# Patient Record
Sex: Male | Born: 1945 | Race: White | Hispanic: No | Marital: Married | Smoking: Former smoker
Health system: Southern US, Community
[De-identification: ages and names within clinical notes are randomized; demographics above are authoritative.]

## PROBLEM LIST (undated history)

## (undated) DIAGNOSIS — E785 Hyperlipidemia, unspecified: Secondary | ICD-10-CM

## (undated) DIAGNOSIS — G510 Bell's palsy: Secondary | ICD-10-CM

## (undated) DIAGNOSIS — E119 Type 2 diabetes mellitus without complications: Secondary | ICD-10-CM

## (undated) DIAGNOSIS — Z8669 Personal history of other diseases of the nervous system and sense organs: Secondary | ICD-10-CM

## (undated) DIAGNOSIS — D649 Anemia, unspecified: Secondary | ICD-10-CM

## (undated) DIAGNOSIS — I739 Peripheral vascular disease, unspecified: Secondary | ICD-10-CM

## (undated) DIAGNOSIS — I10 Essential (primary) hypertension: Secondary | ICD-10-CM

## (undated) DIAGNOSIS — N400 Enlarged prostate without lower urinary tract symptoms: Secondary | ICD-10-CM

## (undated) DIAGNOSIS — R7303 Prediabetes: Secondary | ICD-10-CM

## (undated) HISTORY — PX: TONSILLECTOMY: SUR1361

## (undated) HISTORY — DX: Prediabetes: R73.03

## (undated) HISTORY — DX: Peripheral vascular disease, unspecified: I73.9

## (undated) HISTORY — DX: Bell's palsy: G51.0

## (undated) HISTORY — PX: BACK SURGERY: SHX140

## (undated) HISTORY — PX: PROSTATE SURGERY: SHX751

## (undated) HISTORY — DX: Hyperlipidemia, unspecified: E78.5

---

## 2004-02-09 ENCOUNTER — Ambulatory Visit: Payer: Self-pay | Admitting: Ophthalmology

## 2008-01-07 ENCOUNTER — Ambulatory Visit: Payer: Self-pay | Admitting: Gastroenterology

## 2010-08-05 ENCOUNTER — Emergency Department: Payer: Self-pay | Admitting: Emergency Medicine

## 2010-08-07 ENCOUNTER — Emergency Department: Payer: Self-pay | Admitting: Emergency Medicine

## 2010-08-18 ENCOUNTER — Ambulatory Visit: Payer: Self-pay | Admitting: Urology

## 2010-08-23 ENCOUNTER — Ambulatory Visit: Payer: Self-pay | Admitting: Urology

## 2016-08-23 ENCOUNTER — Telehealth: Payer: Self-pay | Admitting: Cardiovascular Disease

## 2016-08-23 DIAGNOSIS — Z83438 Family history of other disorder of lipoprotein metabolism and other lipidemia: Secondary | ICD-10-CM

## 2016-08-23 NOTE — Telephone Encounter (Signed)
Spoke with patient and confirmed CT calcium scoring test scheduled for 08/29/16 at 4:00 PM and to arrive at 3:45 PM to register and cost will be $150.00 the day of testing. He verbalized understanding of our conversation, agreement with plan, and had no further questions at this time.

## 2016-08-23 NOTE — Telephone Encounter (Signed)
Pt calling stating he lives next door to Dr Mariah MillingGollan and was told that he would be receiving a call from us to schedule a CT Calcium score   Please advise if patient can get this done before seeing Dr Mariah MillingGollan

## 2016-08-23 NOTE — Telephone Encounter (Signed)
Patient states that he is Dr. Windell HummingbirdGollan's neighbor and they were discussing CT calcium scoring. Let him know that I would get it scheduled and give him a call back. He was appreciative for the call.

## 2016-08-29 ENCOUNTER — Ambulatory Visit (INDEPENDENT_AMBULATORY_CARE_PROVIDER_SITE_OTHER)
Admission: RE | Admit: 2016-08-29 | Discharge: 2016-08-29 | Disposition: A | Discharge status: Home / Self Care | Payer: Self-pay | Source: Ambulatory Visit | Attending: Cardiovascular Disease | Admitting: Cardiovascular Disease

## 2016-08-29 DIAGNOSIS — Z8349 Family history of other endocrine, nutritional and metabolic diseases: Secondary | ICD-10-CM

## 2016-08-29 DIAGNOSIS — Z83438 Family history of other disorder of lipoprotein metabolism and other lipidemia: Secondary | ICD-10-CM

## 2016-08-31 ENCOUNTER — Telehealth: Payer: Self-pay | Admitting: *Deleted

## 2016-08-31 DIAGNOSIS — R9430 Abnormal result of cardiovascular function study, unspecified: Secondary | ICD-10-CM

## 2016-08-31 NOTE — Telephone Encounter (Signed)
-----   Message from Antonieta Ibaimothy J Gollan, MD sent at 08/31/2016 11:51 AM EDT ----- CT coronary calcium score markedly elevated Mr Cheree DittoGraham is aware Would order exercise myoview with me next week Suggest crestor 40 daily (would check with Dr. Judithann SheenSparks) He is on aspirin 81 daily

## 2016-08-31 NOTE — Telephone Encounter (Signed)
Left voicemail message to call back  

## 2016-09-01 ENCOUNTER — Encounter: Payer: Self-pay | Admitting: *Deleted

## 2016-09-01 MED ORDER — ASPIRIN EC 81 MG PO TBEC: 81.0000 mg | DELAYED_RELEASE_TABLET | Freq: Every day | ORAL | 3 refills | Status: AC

## 2016-09-01 NOTE — Telephone Encounter (Signed)
Spoke with patient and reviewed with him that Dr. Mariah MillingGollan wanted me to give him a call and schedule stress test with him. He verbalized understanding and agreement with plan. Put him in for stress test next Wednesday 09/07/16 at 08:00 AM and reviewed all instructions for testing. He verbalized understanding and requested that I mail him letter with this information. He was very appreciative for the call and had no further questions at this time.

## 2016-09-06 ENCOUNTER — Telehealth: Payer: Self-pay | Admitting: Cardiovascular Disease

## 2016-09-06 NOTE — Telephone Encounter (Signed)
Left detailed voicemail message with stress test instructions, appointment information, and number to call back if any further questions.

## 2016-09-07 ENCOUNTER — Encounter
Admission: RE | Admit: 2016-09-07 | Discharge: 2016-09-07 | Disposition: A | Discharge status: Home / Self Care | Payer: Medicare Other | Source: Ambulatory Visit | Attending: Cardiovascular Disease | Admitting: Cardiovascular Disease

## 2016-09-07 DIAGNOSIS — R9430 Abnormal result of cardiovascular function study, unspecified: Secondary | ICD-10-CM | POA: Insufficient documentation

## 2016-09-07 LAB — NM MYOCAR MULTI W/SPECT W/WALL MOTION / EF
CHL CUP NUCLEAR SDS: 0
CHL CUP NUCLEAR SSS: 2
CSEPED: 9 min
CSEPEDS: 7 s
Estimated workload: 10.1 METS
LV sys vol: 32 mL
LVDIAVOL: 86 mL (ref 62–150)
Peak HR: 129 {beats}/min
Percent HR: 87 %
Rest HR: 63 {beats}/min
SRS: 5
TID: 0.85

## 2016-09-07 MED ORDER — TECHNETIUM TC 99M TETROFOSMIN IV KIT
13.0000 | PACK | Freq: Once | INTRAVENOUS | Status: AC | PRN
  Administered 2016-09-07: 13.093 via INTRAVENOUS

## 2016-09-07 MED ORDER — TECHNETIUM TC 99M TETROFOSMIN IV KIT
31.2630 | PACK | Freq: Once | INTRAVENOUS | Status: AC | PRN
  Administered 2016-09-07: 31.263 via INTRAVENOUS

## 2016-10-02 DIAGNOSIS — I25118 Atherosclerotic heart disease of native coronary artery with other forms of angina pectoris: Secondary | ICD-10-CM | POA: Insufficient documentation

## 2016-10-02 DIAGNOSIS — E782 Mixed hyperlipidemia: Secondary | ICD-10-CM | POA: Insufficient documentation

## 2016-10-02 NOTE — Progress Notes (Signed)
Cardiology Office Note  Date:  10/03/2016   ID:  Vincent Kirby, DOB 11/23/1945, MRN [id number]  PCP:  Marguarite ArbourSparks, Jeffrey D, MD   Chief Complaint  Patient presents with  . other    Self ref for cardiac evaluation. Meds reviewed by the pt. verbally. Pt. denies chest pain or shortness of breath.     HPI:  71 yo gentleman with  CAD on CT scan Stress test with no ischemia, Hyperlipidemia, 240 on no statin Prior smoking history 15 years,  Who presents to establish care in the office for follow-up of his coronary artery disease  He denies any chest pain concerning for angina He was concerned about his risk factors for coronary artery disease given strong family history Underwent workup as below  CT coronary calcium score 08/29/2016 Coronary calcium score of 1645. This was 4789 percentile for age and sex matched control. This a very high calcium score.  Stress test performed 09/07/2016 showing no ischemia, ejection fraction 58%, hypertension at peak stress  EKG personally reviewed by myself on todays visit Shows normal sinus rhythm with no significant ST or T-wave changes  Lab work reviewed personally by myself showing Hemoglobin A1c 5.9 Recent total cholesterol 240 09/25/2015 On his statin total cholesterol 160s Currently on simvastatin 20 mg daily own  EKG personally reviewed by myself on todays visit Shows normal sinus rhythm with rate 61 bpm no significant ST or T-wave changes   PMH:   has a past medical history of Bell's palsy; Hyperlipidemia; Pre-diabetes; and PVD (peripheral vascular disease) (HCC).  PSH:    Past Surgical History:  Procedure Laterality Date  . BACK SURGERY     x 3   . TONSILLECTOMY      Current Outpatient Prescriptions  Medication Sig Dispense Refill  . aspirin EC 81 MG tablet Take 1 tablet (81 mg total) by mouth daily. 90 tablet 3  . Multiple Vitamin (MULTI-VITAMINS) TABS Take by mouth daily.     . simvastatin (ZOCOR) 20 MG tablet Take 20 mg by  mouth at bedtime.    . sodium chloride (OCEAN) 0.65 % nasal spray Place 2 sprays into the nose.      No current facility-administered medications for this visit.      Allergies:   Patient has no known allergies.   Social History:  The patient  reports that he quit smoking about 38 years ago. He has a 15.00 pack-year smoking history. He has never used smokeless tobacco. He reports that he does not drink alcohol or use drugs.   Family History:   family history includes Hypertension in his brother, father, and mother; Stroke (age of onset: 6184) in his father; Stroke (age of onset: 10393) in his mother.    Review of Systems: Review of Systems  Constitutional: Negative.   Respiratory: Negative.   Cardiovascular: Negative.   Gastrointestinal: Negative.   Musculoskeletal: Negative.   Neurological: Negative.   Psychiatric/Behavioral: Negative.   All other systems reviewed and are negative.    PHYSICAL EXAM: VS:  BP 138/70 (BP Location: Right Arm, Patient Position: Sitting, Cuff Size: Normal)   Ht 6' (1.829 m)   Wt 169 lb (76.7 kg)   BMI 22.92 kg/m  , BMI Body mass index is 22.92 kg/m. GEN: Well nourished, well developed, in no acute distress  HEENT: normal  Neck: no JVD, carotid bruits, or masses Cardiac: RRR; no murmurs, rubs, or gallops,no edema  Respiratory:  clear to auscultation bilaterally, normal work of breathing GI: soft, nontender,  nondistended, + BS MS: no deformity or atrophy  Skin: warm and dry, no rash Neuro:  Strength and sensation are intact Psych: euthymic mood, full affect the   Recent Labs: No results found for requested labs within last 8760 hours.    Lipid Panel No results found for: CHOL, HDL, LDLCALC, TRIG    Wt Readings from Last 3 Encounters:  10/03/16 169 lb (76.7 kg)       ASSESSMENT AND PLAN:  Coronary artery disease of native artery of native heart with stable angina pectoris (HCC) Images reviewed with him, CT scan showing diffuse heavy  coronary calcifications in the LAD and RCA in particular. Score of 1700 Based on this scan we did stress testing showing normal perfusion with no ischemia He is also asymptomatic with no symptoms concerning for angina, no further testing at this time. Discussed symptoms of angina to watch for.   Mixed hyperlipidemia Cholesterol above goal given underlying coronary disease on CT scan Recommended he take Crestor 40 mg daily Would cut the pill in half for several weeks before increasing to a full pill I did discuss other options including staying on simvastatin and adding zetia daily   Disposition:   F/U  As needed   Total encounter time more than 45 minutes  Greater than 50% was spent in counseling and coordination of care with the patient   No orders of the defined types were placed in this encounter.    Signed, Dossie Arbour, M.D., Ph.D. 10/03/2016  [facility] [place], [place] [phone]

## 2016-10-03 ENCOUNTER — Encounter: Payer: Self-pay | Admitting: Cardiovascular Disease

## 2016-10-03 ENCOUNTER — Ambulatory Visit (INDEPENDENT_AMBULATORY_CARE_PROVIDER_SITE_OTHER): Payer: Medicare Other | Admitting: Cardiovascular Disease

## 2016-10-03 VITALS — BP 138/70 | HR 61 | Ht 72.0 in | Wt 169.0 lb

## 2016-10-03 DIAGNOSIS — E782 Mixed hyperlipidemia: Secondary | ICD-10-CM | POA: Diagnosis not present

## 2016-10-03 DIAGNOSIS — I209 Angina pectoris, unspecified: Secondary | ICD-10-CM | POA: Diagnosis not present

## 2016-10-03 DIAGNOSIS — I25118 Atherosclerotic heart disease of native coronary artery with other forms of angina pectoris: Secondary | ICD-10-CM

## 2016-10-03 MED ORDER — ROSUVASTATIN CALCIUM 40 MG PO TABS: 40.0000 mg | ORAL_TABLET | Freq: Every day | ORAL | 4 refills | Status: DC

## 2016-10-03 NOTE — Patient Instructions (Addendum)
Medication Instructions:   Option 1 Stay on simvastatin and add zetia  Option 2 Start crestor 40 mg daily   Labwork:  No new labs needed  Testing/Procedures:  No further testing at this time   Follow-Up: It was a pleasure seeing you in the office today. Please call us if you have new issues that need to be addressed before your next appt.  [phone]  Your physician wants you to follow-up in:  As needed  If you need a refill on your cardiac medications before your next appointment, please call your pharmacy.

## 2018-01-29 ENCOUNTER — Encounter: Payer: Self-pay | Admitting: *Deleted

## 2018-01-30 ENCOUNTER — Ambulatory Visit: Payer: Medicare Other | Admitting: Anesthesiology

## 2018-01-30 ENCOUNTER — Encounter
Admission: RE | Disposition: A | Discharge status: Home / Self Care | Payer: Self-pay | Source: Ambulatory Visit | Attending: Gastroenterology

## 2018-01-30 ENCOUNTER — Ambulatory Visit
Admission: RE | Admit: 2018-01-30 | Discharge: 2018-01-30 | Disposition: A | Discharge status: Home / Self Care | Payer: Medicare Other | Source: Ambulatory Visit | Attending: Gastroenterology | Admitting: Gastroenterology

## 2018-01-30 DIAGNOSIS — Z7982 Long term (current) use of aspirin: Secondary | ICD-10-CM | POA: Diagnosis not present

## 2018-01-30 DIAGNOSIS — K621 Rectal polyp: Secondary | ICD-10-CM | POA: Diagnosis not present

## 2018-01-30 DIAGNOSIS — E1151 Type 2 diabetes mellitus with diabetic peripheral angiopathy without gangrene: Secondary | ICD-10-CM | POA: Insufficient documentation

## 2018-01-30 DIAGNOSIS — Z1211 Encounter for screening for malignant neoplasm of colon: Secondary | ICD-10-CM | POA: Insufficient documentation

## 2018-01-30 DIAGNOSIS — K635 Polyp of colon: Secondary | ICD-10-CM | POA: Diagnosis not present

## 2018-01-30 DIAGNOSIS — Z79899 Other long term (current) drug therapy: Secondary | ICD-10-CM | POA: Insufficient documentation

## 2018-01-30 DIAGNOSIS — K5730 Diverticulosis of large intestine without perforation or abscess without bleeding: Secondary | ICD-10-CM | POA: Diagnosis not present

## 2018-01-30 DIAGNOSIS — E785 Hyperlipidemia, unspecified: Secondary | ICD-10-CM | POA: Diagnosis not present

## 2018-01-30 DIAGNOSIS — I10 Essential (primary) hypertension: Secondary | ICD-10-CM | POA: Diagnosis not present

## 2018-01-30 HISTORY — DX: Benign prostatic hyperplasia without lower urinary tract symptoms: N40.0

## 2018-01-30 HISTORY — DX: Personal history of other diseases of the nervous system and sense organs: Z86.69

## 2018-01-30 HISTORY — PX: COLONOSCOPY WITH PROPOFOL: SHX5780

## 2018-01-30 HISTORY — DX: Essential (primary) hypertension: I10

## 2018-01-30 HISTORY — DX: Type 2 diabetes mellitus without complications: E11.9

## 2018-01-30 SURGERY — COLONOSCOPY WITH PROPOFOL
Anesthesia: General

## 2018-01-30 MED ORDER — PROPOFOL 500 MG/50ML IV EMUL
INTRAVENOUS | Status: DC | PRN
  Administered 2018-01-30: 80 ug/kg/min via INTRAVENOUS

## 2018-01-30 MED ORDER — PROPOFOL 500 MG/50ML IV EMUL
INTRAVENOUS | Status: AC
  Filled 2018-01-30: qty 50

## 2018-01-30 MED ORDER — SODIUM CHLORIDE 0.9 % IV SOLN
INTRAVENOUS | Status: DC
  Administered 2018-01-30: 1000 mL via INTRAVENOUS

## 2018-01-30 MED ORDER — PROPOFOL 10 MG/ML IV BOLUS
INTRAVENOUS | Status: DC | PRN
  Administered 2018-01-30: 30 mg via INTRAVENOUS
  Administered 2018-01-30: 50 mg via INTRAVENOUS

## 2018-01-30 NOTE — Anesthesia Preprocedure Evaluation (Addendum)
Anesthesia Evaluation  Patient identified by MRN, date of birth, ID band Patient awake    Reviewed: Allergy & Precautions, H&P , NPO status , Patient's Chart, lab work & pertinent test results  Airway Mallampati: III       Dental  (+) Chipped   Pulmonary neg COPD, former smoker,           Cardiovascular hypertension, (-) angina (denies)+ CAD and + Peripheral Vascular Disease       Neuro/Psych  Neuromuscular disease (h/o Bell's palsy) negative psych ROS   GI/Hepatic negative GI ROS, Neg liver ROS,   Endo/Other  diabetes  Renal/GU negative Renal ROS  negative genitourinary   Musculoskeletal   Abdominal   Peds  Hematology negative hematology ROS (+)   Anesthesia Other Findings Right sided facial droop  Past Medical History: No date: Bell's palsy No date: BPH (benign prostatic hyperplasia) No date: Diabetes mellitus without complication (HCC) No date: History of Bell's palsy No date: Hyperlipidemia No date: Hypertension     Comment:  BORDERLINE No date: Pre-diabetes No date: PVD (peripheral vascular disease) (HCC)  Past Surgical History: No date: BACK SURGERY     Comment:  x 3  No date: PROSTATE SURGERY No date: TONSILLECTOMY  BMI    Body Mass Index:  23.06 kg/m      Reproductive/Obstetrics negative OB ROS                            Anesthesia Physical Anesthesia Plan  ASA: III  Anesthesia Plan: General   Post-op Pain Management:    Induction:   PONV Risk Score and Plan: Propofol infusion and TIVA  Airway Management Planned: Natural Airway and Nasal Cannula  Additional Equipment:   Intra-op Plan:   Post-operative Plan:   Informed Consent: I have reviewed the patients History and Physical, chart, labs and discussed the procedure including the risks, benefits and alternatives for the proposed anesthesia with the patient or authorized representative who has indicated  his/her understanding and acceptance.   Dental Advisory Given  Plan Discussed with: Anesthesiologist, CRNA and Surgeon  Anesthesia Plan Comments:        Anesthesia Quick Evaluation

## 2018-01-30 NOTE — Transfer of Care (Signed)
Immediate Anesthesia Transfer of Care Note  Patient: Vincent Kirby  Procedure(s) Performed: COLONOSCOPY WITH PROPOFOL (N/A )  Patient Location: PACU  Anesthesia Type:General  Level of Consciousness: awake  Airway & Oxygen Therapy: Patient Spontanous Breathing  Post-op Assessment: Report given to RN  Post vital signs: stable  Last Vitals:  Vitals Value Taken Time  BP 101/54 01/30/2018 10:35 AM  Temp 36.2 C 01/30/2018 10:34 AM  Pulse 60 01/30/2018 10:36 AM  Resp 12 01/30/2018 10:36 AM  SpO2 99 % 01/30/2018 10:36 AM  Vitals shown include unvalidated device data.  Last Pain:  Vitals:   01/30/18 1034  TempSrc: Tympanic  PainSc: 0-No pain         Complications: No apparent anesthesia complications

## 2018-01-30 NOTE — H&P (Signed)
Outpatient short stay form Pre-procedure 01/30/2018 10:00 AM Christena DeemMartin U Gerline Ratto MD  Primary Physician: Dr. Aram BeechamJeffrey Sparks  Reason for visit: Colonoscopy  History of present illness: Patient is a 73 year old male presenting today for colonoscopy in regards colon cancer screening.  His last colonoscopy was in 2009 showing diverticulosis at that time negative otherwise.  He tolerated his prep well.  He takes no aspirin or blood thinning agent the exception of a 81 mg aspirin..    Current Facility-Administered Medications:  .  0.9 %  sodium chloride infusion, , Intravenous, Continuous, Christena DeemSkulskie, Richrd Kuzniar U, MD, Last Rate: 20 mL/hr at 01/30/18 0932, 1,000 mL at 01/30/18 0932  Facility-Administered Medications Ordered in Other Encounters:  .  propofol (DIPRIVAN) 500 MG/50ML infusion, , , Continuous PRN, Camille BalSandstrom, Tony O, CRNA, Last Rate: 37 mL/hr at 01/30/18 0959, 80 mcg/kg/min at 01/30/18 0959  Medications Prior to Admission  Medication Sig Dispense Refill Last Dose  . aspirin EC 81 MG tablet Take 1 tablet (81 mg total) by mouth daily. 90 tablet 3 Past Week at Unknown time  . Multiple Vitamin (MULTI-VITAMINS) TABS Take by mouth daily.    Past Week at Unknown time  . rosuvastatin (CRESTOR) 40 MG tablet Take 1 tablet (40 mg total) by mouth daily. 90 tablet 4 Past Week at Unknown time  . simvastatin (ZOCOR) 20 MG tablet Take 20 mg by mouth at bedtime.   Past Week at Unknown time  . sodium chloride (OCEAN) 0.65 % nasal spray Place 2 sprays into the nose.    Past Week at Unknown time     No Known Allergies   Past Medical History:  Diagnosis Date  . Bell's palsy   . BPH (benign prostatic hyperplasia)   . Diabetes mellitus without complication (HCC)   . History of Bell's palsy   . Hyperlipidemia   . Hypertension    BORDERLINE  . Pre-diabetes   . PVD (peripheral vascular disease) (HCC)     Review of systems:      Physical Exam    Heart and lungs: Regular rate and rhythm without rub or  gallop, lungs are bilaterally clear.    HEENT: Normocephalic atraumatic eyes are anicteric    Other:    Pertinant exam for procedure: Soft nontender nondistended bowel sounds positive normoactive.    Planned proceedures: Colonoscopy and indicated procedures. I have discussed the risks benefits and complications of procedures to include not limited to bleeding, infection, perforation and the risk of sedation and the patient wishes to proceed.    Christena DeemMartin U Chane Cowden, MD Gastroenterology 01/30/2018  10:00 AM

## 2018-01-30 NOTE — Op Note (Signed)
[facility] Gastroenterology Patient Name: Vincent Kirby Procedure Date: 01/30/2018 9:48 AM MRN: [id number] Account #: [id number] Date of Birth: 07/20/1945 Admit Type: Outpatient Age: 73 [facility] ENDO ROOM 3 Gender: Male Note Status: Finalized Procedure:            Colonoscopy Indications:          Screening for colorectal malignant neoplasm Providers:            Christena DeemMartin U. Harman Ferrin, MD Referring MD:         Duane LopeJeffrey D. Judithann SheenSparks, MD (Referring MD) Medicines:            Monitored Anesthesia Care Complications:        No immediate complications. Procedure:            Pre-Anesthesia Assessment:                       - ASA Grade Assessment: III - A patient with severe                        systemic disease.                       After obtaining informed consent, the colonoscope was                        passed under direct vision. Throughout the procedure,                        the patient's blood pressure, pulse, and oxygen                        saturations were monitored continuously. The was                        introduced through the anus and advanced to the the                        cecum, identified by appendiceal orifice and ileocecal                        valve. The colonoscopy was performed with moderate                        difficulty due to a tortuous colon. Successful                        completion of the procedure was aided by changing the                        patient to a prone position. The patient tolerated the                        procedure well. The quality of the bowel preparation                        was good. Findings:      Multiple medium-mouthed diverticula were found in the sigmoid colon and       descending colon.      A 2 mm polyp was found in the descending colon. The polyp was sessile.  The polyp was removed with a cold biopsy forceps. Resection and       retrieval were complete.      A 1 mm polyp was found in the  rectum. The polyp was sessile. The polyp       was removed with a cold biopsy forceps. Resection and retrieval were       complete.      The retroflexed view of the distal rectum and anal verge was normal and       showed no anal or rectal abnormalities.      The digital rectal exam was normal. Impression:           - Diverticulosis in the sigmoid colon and in the                        descending colon.                       - One 2 mm polyp in the descending colon, removed with                        a cold biopsy forceps. Resected and retrieved.                       - One 1 mm polyp in the rectum, removed with a cold                        biopsy forceps. Resected and retrieved.                       - The distal rectum and anal verge are normal on                        retroflexion view. Recommendation:       - Discharge patient to home.                       - Soft diet today, then advance as tolerated to advance                        diet as tolerated. Procedure Code(s):    --- Professional ---                       [phone], Colonoscopy, flexible; with biopsy, single or                        multiple Diagnosis Code(s):    --- Professional ---                       Z12.11, Encounter for screening for malignant neoplasm                        of colon                       D12.4, Benign neoplasm of descending colon                       K62.1, Rectal polyp  K57.30, Diverticulosis of large intestine without                        perforation or abscess without bleeding CPT copyright 2018 American Medical Association. All rights reserved. The codes documented in this report are preliminary and upon coder review may  be revised to meet current compliance requirements. Christena Deem, MD 01/30/2018 10:34:18 AM This report has been signed electronically. Number of Addenda: 0 Note Initiated On: 01/30/2018 9:48 AM Scope Withdrawal Time: 0 hours 8 minutes 29  seconds  Total Procedure Duration: 0 hours 23 minutes 0 seconds       [facility]

## 2018-01-30 NOTE — Anesthesia Post-op Follow-up Note (Signed)
Anesthesia QCDR form completed.        

## 2018-01-31 ENCOUNTER — Encounter: Payer: Self-pay | Admitting: Gastroenterology

## 2018-01-31 LAB — SURGICAL PATHOLOGY

## 2018-01-31 NOTE — Anesthesia Postprocedure Evaluation (Signed)
Anesthesia Post Note  Patient: Deaken Gillitzer  Procedure(s) Performed: COLONOSCOPY WITH PROPOFOL (N/A )  Patient location during evaluation: PACU Anesthesia Type: General Level of consciousness: awake and alert Pain management: pain level controlled Vital Signs Assessment: post-procedure vital signs reviewed and stable Respiratory status: spontaneous breathing, nonlabored ventilation and respiratory function stable Cardiovascular status: blood pressure returned to baseline and stable Postop Assessment: no apparent nausea or vomiting Anesthetic complications: no     Last Vitals:  Vitals:   01/30/18 1044 01/30/18 1054  BP: 114/73 125/70  Pulse: (!) 58 62  Resp: 17 19  Temp:    SpO2: 100% 100%    Last Pain:  Vitals:   01/31/18 0739  TempSrc:   PainSc: 0-No pain                 Jovita Gamma

## 2022-01-11 ENCOUNTER — Ambulatory Visit
Admission: RE | Admit: 2022-01-11 | Discharge: 2022-01-11 | Disposition: A | Discharge status: Home / Self Care | Payer: Medicare HMO | Source: Ambulatory Visit | Attending: Physician Assistant | Admitting: Physician Assistant

## 2022-01-11 ENCOUNTER — Other Ambulatory Visit: Payer: Self-pay | Admitting: Physician Assistant

## 2022-01-11 DIAGNOSIS — S32010A Wedge compression fracture of first lumbar vertebra, initial encounter for closed fracture: Secondary | ICD-10-CM | POA: Insufficient documentation

## 2022-08-09 ENCOUNTER — Ambulatory Visit: Payer: Self-pay | Admitting: General Surgery

## 2022-08-09 NOTE — H&P (View-Only) (Signed)
HISTORY OF PRESENT ILLNESS:    Vincent Kirby is a 77 y.o.male patient who comes for follow up for evaluation of right inguinal hernia.  Patient previously seen on May 26, 2022.  He was complaining of right inguinal hernia.  Patient endorses having a bulge in the right inguinal area.  He is able to reduce the bulge.  The bulge gets uncomfortable.  He is pretty active.  No pain radiation.  Aggravating factors certain activities.  No alleviating factors.  He denies any episode of abdominal distention, nausea or vomiting.  No previous intra-abdominal surgeries.  When he was previously evaluated and we recommended surgical intervention for the right inguinal hernia.  At that time he was obesity and he wanted to hold the surgery until August.  He comes back for preoperative history and physical exam.      PAST MEDICAL HISTORY:  Past Medical History:  Diagnosis Date   Borderline hypertension    BPH (benign prostatic hypertrophy)    Diet-controlled diabetes mellitus (CMS/HHS-HCC)    History of Bell's palsy    Hyperlipidemia    Peripheral vascular disease (CMS-HCC)         PAST SURGICAL HISTORY:   Past Surgical History:  Procedure Laterality Date   COLONOSCOPY  01/30/2018   Hyperplastic colon polyp/Repeat 34yrs/MUS   prostate laser surgery     Status post back trauma with surgery x three           MEDICATIONS:  Outpatient Encounter Medications as of 08/09/2022  Medication Sig Dispense Refill   aspirin 81 MG EC tablet Take 81 mg by mouth once daily.     multivitamin tablet Take 1 tablet by mouth once daily.     rosuvastatin (CRESTOR) 20 MG tablet Take 1 tablet (20 mg total) by mouth once daily 90 tablet 4   No facility-administered encounter medications on file as of 08/09/2022.     ALLERGIES:   Patient has no known allergies.   SOCIAL HISTORY:  Social History   Socioeconomic History   Marital status: Married  Tobacco Use   Smoking status: Former   Smokeless tobacco: Never  Theatre manager   Vaping status: Never Used  Substance and Sexual Activity   Alcohol use: No   Drug use: No   Sexual activity: Defer    FAMILY HISTORY:  Family History  Problem Relation Name Age of Onset   Coronary Artery Disease (Blocked arteries around heart) Other     High blood pressure (Hypertension) Other     Stroke Other     Ulcerative colitis Brother       GENERAL REVIEW OF SYSTEMS:   General ROS: negative for - chills, fatigue, fever, weight gain or weight loss Allergy and Immunology ROS: negative for - hives  Hematological and Lymphatic ROS: negative for - bleeding problems or bruising, negative for palpable nodes Endocrine ROS: negative for - heat or cold intolerance, hair changes Respiratory ROS: negative for - cough, shortness of breath or wheezing Cardiovascular ROS: no chest pain or palpitations GI ROS: negative for nausea, vomiting, abdominal pain, diarrhea, constipation Musculoskeletal ROS: negative for - joint swelling or muscle pain Neurological ROS: negative for - confusion, syncope Dermatological ROS: negative for pruritus and rash  PHYSICAL EXAM:  Vitals:   08/09/22 0743  BP: (!) 141/69  Pulse: 61  .  Ht:177.8 cm (5\' 10" ) Wt:78.9 kg (174 lb) QQV:ZDGL surface area is 1.97 meters squared. Body mass index is 24.97 kg/m.Marland Kitchen   GENERAL: Alert, active, oriented x3  HEENT: Pupils equal reactive to light. Extraocular movements are intact. Sclera clear. Palpebral conjunctiva normal red color.Pharynx clear.  NECK: Supple with no palpable mass and no adenopathy.  LUNGS: Sound clear with no rales rhonchi or wheezes.  HEART: Regular rhythm S1 and S2 without murmur.  ABDOMEN: Soft and depressible, nontender with no palpable mass, no hepatomegaly.  Right inguinal hernia, reducible  EXTREMITIES: Well-developed well-nourished symmetrical with no dependent edema.  NEUROLOGICAL: Awake alert oriented, facial expression symmetrical, moving all extremities.      IMPRESSION:      Non-recurrent unilateral inguinal hernia without obstruction or gangrene [K40.90]    Patient oriented again about the diagnosis of right inguinal hernia. He was oriented about the implication of having a hernia, age risk. He was also oriented about the treatment with surgical intervention.   He was oriented about the risk of surgery including bleeding, infection, injury to adjacent organs such as bladder, vas deferens, femoral artery or vein, nerves, chronic pain, among others.  The patient reported he understood and agreed to proceed with robotic assisted laparoscopic right inguinal hernia repair with manage.   PLAN:  1.  Robotic assisted laparoscopic right inguinal hernia repair with mesh (16109) 2.  Hold aspirin 5 days before procedure 3.  Contact us if has any question or concern.   Patient verbalized understanding, all questions were answered, and were agreeable with the plan outlined above.   Vincent Shiver, MD  Electronically signed by Vincent Shiver, MD

## 2022-08-09 NOTE — H&P (Signed)
HISTORY OF PRESENT ILLNESS:    Mr. Doxtater is a 77 y.o.male patient who comes for follow up for evaluation of right inguinal hernia.  Patient previously seen on May 26, 2022.  He was complaining of right inguinal hernia.  Patient endorses having a bulge in the right inguinal area.  He is able to reduce the bulge.  The bulge gets uncomfortable.  He is pretty active.  No pain radiation.  Aggravating factors certain activities.  No alleviating factors.  He denies any episode of abdominal distention, nausea or vomiting.  No previous intra-abdominal surgeries.  When he was previously evaluated and we recommended surgical intervention for the right inguinal hernia.  At that time he was obesity and he wanted to hold the surgery until August.  He comes back for preoperative history and physical exam.      PAST MEDICAL HISTORY:  Past Medical History:  Diagnosis Date   Borderline hypertension    BPH (benign prostatic hypertrophy)    Diet-controlled diabetes mellitus (CMS/HHS-HCC)    History of Bell's palsy    Hyperlipidemia    Peripheral vascular disease (CMS-HCC)         PAST SURGICAL HISTORY:   Past Surgical History:  Procedure Laterality Date   COLONOSCOPY  01/30/2018   Hyperplastic colon polyp/Repeat 34yrs/MUS   prostate laser surgery     Status post back trauma with surgery x three           MEDICATIONS:  Outpatient Encounter Medications as of 08/09/2022  Medication Sig Dispense Refill   aspirin 81 MG EC tablet Take 81 mg by mouth once daily.     multivitamin tablet Take 1 tablet by mouth once daily.     rosuvastatin (CRESTOR) 20 MG tablet Take 1 tablet (20 mg total) by mouth once daily 90 tablet 4   No facility-administered encounter medications on file as of 08/09/2022.     ALLERGIES:   Patient has no known allergies.   SOCIAL HISTORY:  Social History   Socioeconomic History   Marital status: Married  Tobacco Use   Smoking status: Former   Smokeless tobacco: Never  Theatre manager   Vaping status: Never Used  Substance and Sexual Activity   Alcohol use: No   Drug use: No   Sexual activity: Defer    FAMILY HISTORY:  Family History  Problem Relation Name Age of Onset   Coronary Artery Disease (Blocked arteries around heart) Other     High blood pressure (Hypertension) Other     Stroke Other     Ulcerative colitis Brother       GENERAL REVIEW OF SYSTEMS:   General ROS: negative for - chills, fatigue, fever, weight gain or weight loss Allergy and Immunology ROS: negative for - hives  Hematological and Lymphatic ROS: negative for - bleeding problems or bruising, negative for palpable nodes Endocrine ROS: negative for - heat or cold intolerance, hair changes Respiratory ROS: negative for - cough, shortness of breath or wheezing Cardiovascular ROS: no chest pain or palpitations GI ROS: negative for nausea, vomiting, abdominal pain, diarrhea, constipation Musculoskeletal ROS: negative for - joint swelling or muscle pain Neurological ROS: negative for - confusion, syncope Dermatological ROS: negative for pruritus and rash  PHYSICAL EXAM:  Vitals:   08/09/22 0743  BP: (!) 141/69  Pulse: 61  .  Ht:177.8 cm (5\' 10" ) Wt:78.9 kg (174 lb) QQV:ZDGL surface area is 1.97 meters squared. Body mass index is 24.97 kg/m.Marland Kitchen   GENERAL: Alert, active, oriented x3  HEENT: Pupils equal reactive to light. Extraocular movements are intact. Sclera clear. Palpebral conjunctiva normal red color.Pharynx clear.  NECK: Supple with no palpable mass and no adenopathy.  LUNGS: Sound clear with no rales rhonchi or wheezes.  HEART: Regular rhythm S1 and S2 without murmur.  ABDOMEN: Soft and depressible, nontender with no palpable mass, no hepatomegaly.  Right inguinal hernia, reducible  EXTREMITIES: Well-developed well-nourished symmetrical with no dependent edema.  NEUROLOGICAL: Awake alert oriented, facial expression symmetrical, moving all extremities.      IMPRESSION:      Non-recurrent unilateral inguinal hernia without obstruction or gangrene [K40.90]    Patient oriented again about the diagnosis of right inguinal hernia. He was oriented about the implication of having a hernia, age risk. He was also oriented about the treatment with surgical intervention.   He was oriented about the risk of surgery including bleeding, infection, injury to adjacent organs such as bladder, vas deferens, femoral artery or vein, nerves, chronic pain, among others.  The patient reported he understood and agreed to proceed with robotic assisted laparoscopic right inguinal hernia repair with manage.   PLAN:  1.  Robotic assisted laparoscopic right inguinal hernia repair with mesh (16109) 2.  Hold aspirin 5 days before procedure 3.  Contact us if has any question or concern.   Patient verbalized understanding, all questions were answered, and were agreeable with the plan outlined above.   Carolan Shiver, MD  Electronically signed by Carolan Shiver, MD

## 2022-08-23 ENCOUNTER — Encounter
Admission: RE | Admit: 2022-08-23 | Discharge: 2022-08-23 | Disposition: A | Discharge status: Home / Self Care | Payer: Medicare HMO | Source: Ambulatory Visit | Attending: General Surgery | Admitting: General Surgery

## 2022-08-23 ENCOUNTER — Other Ambulatory Visit: Payer: Self-pay

## 2022-08-23 NOTE — Patient Instructions (Signed)
Your procedure is scheduled on: Wednesday 08/31/22 To find out your arrival time, please call [phone] between 1PM - 3PM on:   Tuesday 08/30/22 Report to the Registration Desk on the 1st floor of the [facility]. Free Valet parking is available.  If your arrival time is 6:00 am, do not arrive before that time as the [facility] entrance doors do not open until 6:00 am.  REMEMBER: Instructions that are not followed completely may result in serious medical risk, up to and including death; or upon the discretion of your surgeon and anesthesiologist your surgery may need to be rescheduled.  Do not eat food or drink any liquids after midnight the night before surgery.  No gum chewing or hard candies.  One week prior to surgery: Stop Anti-inflammatories (NSAIDS) such as Advil, Aleve, Ibuprofen, Motrin, Naproxen, Naprosyn and Aspirin based products such as Excedrin, Goody's Powder, BC Powder. You may however, continue to take Tylenol if needed for pain up until the day of surgery.  Stop ANY OVER THE COUNTER supplements or vitamins until after surgery.  Continue taking all prescribed medications. Hold Aspirin for 5 days starting Friday 08/26/22  TAKE ONLY THESE MEDICATIONS THE MORNING OF SURGERY WITH A SIP OF WATER:  rosuvastatin (CRESTOR) 20 MG tablet   No Alcohol for 24 hours before or after surgery.  No Smoking including e-cigarettes for 24 hours before surgery.  No chewable tobacco products for at least 6 hours before surgery.  No nicotine patches on the day of surgery.  Do not use any "recreational" drugs for at least a week (preferably 2 weeks) before your surgery.  Please be advised that the combination of cocaine and anesthesia may have negative outcomes, up to and including death. If you test positive for cocaine, your surgery will be cancelled.  On the morning of surgery brush your teeth with toothpaste and water, you may rinse your mouth with mouthwash if you wish. Do not  swallow any toothpaste or mouthwash.  Use CHG Soap or wipes as directed on instruction sheet.  Do not wear lotions, powders, or perfumes.   Do not shave body hair from the neck down 48 hours before surgery.  Wear comfortable clothing (specific to your surgery type) to the hospital.  Do not wear jewelry, make-up, hairpins, clips or nail polish.  Contact lenses, hearing aids and dentures may not be worn into surgery.  Do not bring valuables to the hospital. [facility] is not responsible for any missing/lost belongings or valuables.   Notify your doctor if there is any change in your medical condition (cold, fever, infection).  If you are being discharged the day of surgery, you will not be allowed to drive home. You will need a responsible individual to drive you home and stay with you for 24 hours after surgery.   If you are taking public transportation, you will need to have a responsible individual with you.  If you are being admitted to the hospital overnight, leave your suitcase in the car. After surgery it may be brought to your room.  In case of increased patient census, it may be necessary for you, the patient, to continue your postoperative care in the Same Day Surgery department.  After surgery, you can help prevent lung complications by doing breathing exercises.  Take deep breaths and cough every 1-2 hours. Your doctor may order a device called an Incentive Spirometer to help you take deep breaths. When coughing or sneezing, hold a pillow firmly against your  incision with both hands. This is called "splinting." Doing this helps protect your incision. It also decreases belly discomfort.  Surgery Visitation Policy:  Patients undergoing a surgery or procedure may have two family members or support persons with them as long as the person is not COVID-19 positive or experiencing its symptoms.   Inpatient Visitation:    Visiting hours are 7 a.m. to 8 p.m. Up to four  visitors are allowed at one time in a patient room. The visitors may rotate out with other people during the day. One designated support person (adult) may remain overnight.  Please call the Pre-admissions Testing Dept. at [phone] if you have any questions about these instructions.     Preparing for Surgery with CHLORHEXIDINE GLUCONATE (CHG) Soap  Chlorhexidine Gluconate (CHG) Soap  o An antiseptic cleaner that kills germs and bonds with the skin to continue killing germs even after washing  o Used for showering the night before surgery and morning of surgery  Before surgery, you can play an important role by reducing the number of germs on your skin.  CHG (Chlorhexidine gluconate) soap is an antiseptic cleanser which kills germs and bonds with the skin to continue killing germs even after washing.  Please do not use if you have an allergy to CHG or antibacterial soaps. If your skin becomes reddened/irritated stop using the CHG.  1. Shower the NIGHT BEFORE SURGERY and the MORNING OF SURGERY with CHG soap.  2. If you choose to wash your hair, wash your hair first as usual with your normal shampoo.  3. After shampooing, rinse your hair and body thoroughly to remove the shampoo.  4. Use CHG as you would any other liquid soap. You can apply CHG directly to the skin and wash gently with a scrungie or a clean washcloth.  5. Apply the CHG soap to your body only from the neck down. Do not use on open wounds or open sores. Avoid contact with your eyes, ears, mouth, and genitals (private parts). Wash face and genitals (private parts) with your normal soap.  6. Wash thoroughly, paying special attention to the area where your surgery will be performed.  7. Thoroughly rinse your body with warm water.  8. Do not shower/wash with your normal soap after using and rinsing off the CHG soap.  9. Pat yourself dry with a clean towel.  10. Wear clean pajamas to bed the night before  surgery.  12. Place clean sheets on your bed the night of your first shower and do not sleep with pets.  13. Shower again with the CHG soap on the day of surgery prior to arriving at the hospital.  14. Do not apply any deodorants/lotions/powders.  15. Please wear clean clothes to the hospital.

## 2022-08-31 ENCOUNTER — Other Ambulatory Visit: Payer: Self-pay

## 2022-08-31 ENCOUNTER — Ambulatory Visit: Payer: Medicare HMO | Admitting: Certified Registered"

## 2022-08-31 ENCOUNTER — Encounter: Payer: Self-pay | Admitting: General Surgery

## 2022-08-31 ENCOUNTER — Encounter
Admission: RE | Disposition: A | Discharge status: Home / Self Care | Payer: Self-pay | Source: Home / Self Care | Attending: General Surgery

## 2022-08-31 ENCOUNTER — Ambulatory Visit
Admission: RE | Admit: 2022-08-31 | Discharge: 2022-08-31 | Disposition: A | Discharge status: Home / Self Care | Payer: Medicare HMO | Attending: General Surgery | Admitting: General Surgery

## 2022-08-31 DIAGNOSIS — E1151 Type 2 diabetes mellitus with diabetic peripheral angiopathy without gangrene: Secondary | ICD-10-CM | POA: Diagnosis not present

## 2022-08-31 DIAGNOSIS — I10 Essential (primary) hypertension: Secondary | ICD-10-CM | POA: Insufficient documentation

## 2022-08-31 DIAGNOSIS — E669 Obesity, unspecified: Secondary | ICD-10-CM | POA: Diagnosis not present

## 2022-08-31 DIAGNOSIS — Z87891 Personal history of nicotine dependence: Secondary | ICD-10-CM | POA: Insufficient documentation

## 2022-08-31 DIAGNOSIS — Z8249 Family history of ischemic heart disease and other diseases of the circulatory system: Secondary | ICD-10-CM | POA: Diagnosis not present

## 2022-08-31 DIAGNOSIS — G709 Myoneural disorder, unspecified: Secondary | ICD-10-CM | POA: Insufficient documentation

## 2022-08-31 DIAGNOSIS — K4090 Unilateral inguinal hernia, without obstruction or gangrene, not specified as recurrent: Secondary | ICD-10-CM | POA: Diagnosis not present

## 2022-08-31 DIAGNOSIS — I2510 Atherosclerotic heart disease of native coronary artery without angina pectoris: Secondary | ICD-10-CM | POA: Insufficient documentation

## 2022-08-31 HISTORY — PX: INSERTION OF MESH: SHX5868

## 2022-08-31 HISTORY — PX: XI ROBOTIC ASSISTED INGUINAL HERNIA REPAIR WITH MESH: SHX6706

## 2022-08-31 SURGERY — REPAIR, HERNIA, INGUINAL, ROBOT-ASSISTED, LAPAROSCOPIC, USING MESH
Anesthesia: General | Site: Inguinal | Laterality: Right

## 2022-08-31 MED ORDER — CEFAZOLIN SODIUM-DEXTROSE 2-4 GM/100ML-% IV SOLN
INTRAVENOUS | Status: AC
  Filled 2022-08-31: qty 100

## 2022-08-31 MED ORDER — PROPOFOL 10 MG/ML IV BOLUS
INTRAVENOUS | Status: DC | PRN
Start: 2022-08-31 — End: 2022-08-31
  Administered 2022-08-31: 150 mg via INTRAVENOUS

## 2022-08-31 MED ORDER — ACETAMINOPHEN 10 MG/ML IV SOLN: 1000.0000 mg | Freq: Once | INTRAVENOUS | Status: DC | PRN

## 2022-08-31 MED ORDER — HYDROCODONE-ACETAMINOPHEN 5-325 MG PO TABS: 1.0000 | ORAL_TABLET | ORAL | 0 refills | Status: AC | PRN

## 2022-08-31 MED ORDER — ESMOLOL HCL 100 MG/10ML IV SOLN
INTRAVENOUS | Status: DC | PRN
  Administered 2022-08-31: 10 mg via INTRAVENOUS
  Administered 2022-08-31: 20 mg via INTRAVENOUS

## 2022-08-31 MED ORDER — FENTANYL CITRATE (PF) 100 MCG/2ML IJ SOLN
INTRAMUSCULAR | Status: AC
  Filled 2022-08-31: qty 2

## 2022-08-31 MED ORDER — 0.9 % SODIUM CHLORIDE (POUR BTL) OPTIME
TOPICAL | Status: DC | PRN
  Administered 2022-08-31: 500 mL

## 2022-08-31 MED ORDER — CHLORHEXIDINE GLUCONATE 0.12 % MT SOLN
15.0000 mL | Freq: Once | OROMUCOSAL | Status: AC
  Administered 2022-08-31: 15 mL via OROMUCOSAL

## 2022-08-31 MED ORDER — ONDANSETRON HCL 4 MG/2ML IJ SOLN: 4.0000 mg | Freq: Once | INTRAMUSCULAR | Status: DC | PRN

## 2022-08-31 MED ORDER — DEXMEDETOMIDINE HCL IN NACL 80 MCG/20ML IV SOLN
INTRAVENOUS | Status: DC | PRN
  Administered 2022-08-31: 8 ug via INTRAVENOUS

## 2022-08-31 MED ORDER — FENTANYL CITRATE (PF) 100 MCG/2ML IJ SOLN: 25.0000 ug | INTRAMUSCULAR | Status: DC | PRN

## 2022-08-31 MED ORDER — OXYCODONE HCL 5 MG PO TABS
ORAL_TABLET | ORAL | Status: AC
  Filled 2022-08-31: qty 1

## 2022-08-31 MED ORDER — ROCURONIUM BROMIDE 100 MG/10ML IV SOLN
INTRAVENOUS | Status: DC | PRN
  Administered 2022-08-31: 20 mg via INTRAVENOUS
  Administered 2022-08-31: 40 mg via INTRAVENOUS

## 2022-08-31 MED ORDER — BUPIVACAINE-EPINEPHRINE (PF) 0.25% -1:200000 IJ SOLN
INTRAMUSCULAR | Status: AC
  Filled 2022-08-31: qty 30

## 2022-08-31 MED ORDER — SEVOFLURANE IN SOLN
RESPIRATORY_TRACT | Status: AC
  Filled 2022-08-31: qty 250

## 2022-08-31 MED ORDER — MIDAZOLAM HCL 2 MG/2ML IJ SOLN
INTRAMUSCULAR | Status: AC
  Filled 2022-08-31: qty 2

## 2022-08-31 MED ORDER — LACTATED RINGERS IV SOLN: INTRAVENOUS | Status: DC

## 2022-08-31 MED ORDER — GLYCOPYRROLATE 0.2 MG/ML IJ SOLN
INTRAMUSCULAR | Status: DC | PRN
  Administered 2022-08-31: .2 mg via INTRAVENOUS

## 2022-08-31 MED ORDER — PROPOFOL 10 MG/ML IV BOLUS
INTRAVENOUS | Status: AC
  Filled 2022-08-31: qty 40

## 2022-08-31 MED ORDER — EPHEDRINE SULFATE (PRESSORS) 50 MG/ML IJ SOLN
INTRAMUSCULAR | Status: DC | PRN
  Administered 2022-08-31: 15 mg via INTRAVENOUS
  Administered 2022-08-31: 10 mg via INTRAVENOUS
  Administered 2022-08-31: 5 mg via INTRAVENOUS

## 2022-08-31 MED ORDER — BUPIVACAINE-EPINEPHRINE 0.25% -1:200000 IJ SOLN
INTRAMUSCULAR | Status: DC | PRN
  Administered 2022-08-31: 30 mL

## 2022-08-31 MED ORDER — CHLORHEXIDINE GLUCONATE 0.12 % MT SOLN
OROMUCOSAL | Status: AC
  Filled 2022-08-31: qty 15

## 2022-08-31 MED ORDER — SUGAMMADEX SODIUM 200 MG/2ML IV SOLN
INTRAVENOUS | Status: DC | PRN
  Administered 2022-08-31: 50 mg via INTRAVENOUS
  Administered 2022-08-31: 250 mg via INTRAVENOUS

## 2022-08-31 MED ORDER — OXYCODONE HCL 5 MG/5ML PO SOLN: 5.0000 mg | Freq: Once | ORAL | Status: AC | PRN

## 2022-08-31 MED ORDER — LIDOCAINE HCL (CARDIAC) PF 100 MG/5ML IV SOSY
PREFILLED_SYRINGE | INTRAVENOUS | Status: DC | PRN
  Administered 2022-08-31: 40 mg via INTRAVENOUS

## 2022-08-31 MED ORDER — FAMOTIDINE 20 MG PO TABS
ORAL_TABLET | ORAL | Status: AC
  Filled 2022-08-31: qty 1

## 2022-08-31 MED ORDER — OXYCODONE HCL 5 MG PO TABS
5.0000 mg | ORAL_TABLET | Freq: Once | ORAL | Status: AC | PRN
  Administered 2022-08-31: 5 mg via ORAL

## 2022-08-31 MED ORDER — ORAL CARE MOUTH RINSE: 15.0000 mL | Freq: Once | OROMUCOSAL | Status: AC

## 2022-08-31 MED ORDER — FAMOTIDINE 20 MG PO TABS
20.0000 mg | ORAL_TABLET | Freq: Once | ORAL | Status: AC
  Administered 2022-08-31: 20 mg via ORAL

## 2022-08-31 MED ORDER — CEFAZOLIN SODIUM-DEXTROSE 2-4 GM/100ML-% IV SOLN
2.0000 g | INTRAVENOUS | Status: AC
  Administered 2022-08-31: 2 g via INTRAVENOUS

## 2022-08-31 MED ORDER — DEXAMETHASONE SODIUM PHOSPHATE 10 MG/ML IJ SOLN
INTRAMUSCULAR | Status: DC | PRN
  Administered 2022-08-31: 5 mg via INTRAVENOUS

## 2022-08-31 MED ORDER — ONDANSETRON HCL 4 MG/2ML IJ SOLN
INTRAMUSCULAR | Status: DC | PRN
  Administered 2022-08-31: 4 mg via INTRAVENOUS

## 2022-08-31 MED ORDER — FENTANYL CITRATE (PF) 100 MCG/2ML IJ SOLN
INTRAMUSCULAR | Status: DC | PRN
  Administered 2022-08-31 (×2): 25 ug via INTRAVENOUS
  Administered 2022-08-31: 50 ug via INTRAVENOUS

## 2022-08-31 SURGICAL SUPPLY — 50 items
ADH SKN CLS APL DERMABOND .7 (GAUZE/BANDAGES/DRESSINGS) ×1
BAG PRESSURE INF REUSE 1000 (BAG) IMPLANT
BLADE CLIPPER SURG (BLADE) IMPLANT
BLADE SURG SZ11 CARB STEEL (BLADE) ×1 IMPLANT
COVER TIP SHEARS 8 DVNC (MISCELLANEOUS) ×1 IMPLANT
COVER WAND RF STERILE (DRAPES) ×1 IMPLANT
DERMABOND ADVANCED .7 DNX12 (GAUZE/BANDAGES/DRESSINGS) ×1 IMPLANT
DRAPE ARM DVNC X/XI (DISPOSABLE) ×3 IMPLANT
DRAPE COLUMN DVNC XI (DISPOSABLE) ×1 IMPLANT
ELECT REM PT RETURN 9FT ADLT (ELECTROSURGICAL) ×1
ELECTRODE REM PT RTRN 9FT ADLT (ELECTROSURGICAL) ×1 IMPLANT
FORCEPS BPLR R/ABLATION 8 DVNC (INSTRUMENTS) ×1 IMPLANT
GLOVE BIO SURGEON STRL SZ 6.5 (GLOVE) ×2 IMPLANT
GLOVE BIOGEL PI IND STRL 6.5 (GLOVE) ×2 IMPLANT
GOWN STRL REUS W/ TWL LRG LVL3 (GOWN DISPOSABLE) ×3 IMPLANT
GOWN STRL REUS W/TWL LRG LVL3 (GOWN DISPOSABLE) ×3
IRRIGATOR SUCT 8 DISP DVNC XI (IRRIGATION / IRRIGATOR) IMPLANT
IV CATH ANGIO 12GX3 LT BLUE (NEEDLE) IMPLANT
IV NS 1000ML (IV SOLUTION)
IV NS 1000ML BAXH (IV SOLUTION) IMPLANT
KIT PINK PAD W/HEAD ARE REST (MISCELLANEOUS) ×1
KIT PINK PAD W/HEAD ARM REST (MISCELLANEOUS) ×1 IMPLANT
LABEL OR SOLS (LABEL) IMPLANT
MANIFOLD NEPTUNE II (INSTRUMENTS) ×1 IMPLANT
MESH 3DMAX MID 5X7 RT XLRG (Mesh General) IMPLANT
NDL DRIVE SUT CUT DVNC (INSTRUMENTS) ×1 IMPLANT
NDL HYPO 22X1.5 SAFETY MO (MISCELLANEOUS) ×1 IMPLANT
NDL INSUFFLATION 14GA 120MM (NEEDLE) ×1 IMPLANT
NEEDLE DRIVE SUT CUT DVNC (INSTRUMENTS) ×1 IMPLANT
NEEDLE HYPO 22X1.5 SAFETY MO (MISCELLANEOUS) ×1 IMPLANT
NEEDLE INSUFFLATION 14GA 120MM (NEEDLE) ×1 IMPLANT
NS IRRIG 500ML POUR BTL (IV SOLUTION) IMPLANT
OBTURATOR OPTICAL STND 8 DVNC (TROCAR) ×1
OBTURATOR OPTICALSTD 8 DVNC (TROCAR) ×1 IMPLANT
PACK LAP CHOLECYSTECTOMY (MISCELLANEOUS) ×1 IMPLANT
SCISSORS MNPLR CVD DVNC XI (INSTRUMENTS) ×1 IMPLANT
SEAL UNIV 5-12 XI (MISCELLANEOUS) ×3 IMPLANT
SET TUBE SMOKE EVAC HIGH FLOW (TUBING) ×1 IMPLANT
SOL ELECTROSURG ANTI STICK (MISCELLANEOUS) ×1
SOLUTION ELECTROSURG ANTI STCK (MISCELLANEOUS) ×1 IMPLANT
SUT MNCRL 4-0 (SUTURE) ×1
SUT MNCRL 4-0 27XMFL (SUTURE) ×1
SUT VIC AB 2-0 SH 27 (SUTURE) ×2
SUT VIC AB 2-0 SH 27XBRD (SUTURE) ×1 IMPLANT
SUT VLOC 90 S/L VL9 GS22 (SUTURE) ×1 IMPLANT
SUTURE MNCRL 4-0 27XMF (SUTURE) ×1 IMPLANT
TAPE TRANSPORE STRL 2 31045 (GAUZE/BANDAGES/DRESSINGS) IMPLANT
TRAP FLUID SMOKE EVACUATOR (MISCELLANEOUS) ×1 IMPLANT
TRAY FOLEY MTR SLVR 16FR STAT (SET/KITS/TRAYS/PACK) ×1 IMPLANT
WATER STERILE IRR 500ML POUR (IV SOLUTION) ×1 IMPLANT

## 2022-08-31 NOTE — Discharge Instructions (Addendum)

## 2022-08-31 NOTE — Transfer of Care (Signed)
Immediate Anesthesia Transfer of Care Note  Patient: Vincent Kirby  Procedure(s) Performed: XI ROBOTIC ASSISTED INGUINAL HERNIA REPAIR WITH MESH (Right: Inguinal) INSERTION OF MESH (Right: Inguinal)  Patient Location: PACU  Anesthesia Type:General  Level of Consciousness: awake and alert   Airway & Oxygen Therapy: Patient Spontanous Breathing and Patient connected to face mask oxygen  Post-op Assessment: Report given to RN and Post -op Vital signs reviewed and stable  Post vital signs: Reviewed and stable  Last Vitals:  Vitals Value Taken Time  BP 148/82 08/31/22 1012  Temp 97.4    Pulse 81 08/31/22 1014  Resp 14 08/31/22 1011  SpO2 100 % 08/31/22 1014  Vitals shown include unfiled device data.  Last Pain:  Vitals:   08/31/22 0721  TempSrc: Temporal  PainSc: 0-No pain         Complications: No notable events documented.

## 2022-08-31 NOTE — Anesthesia Preprocedure Evaluation (Addendum)
Anesthesia Evaluation  Patient identified by MRN, date of birth, ID band Patient awake    Reviewed: Allergy & Precautions, NPO status , Patient's Chart, lab work & pertinent test results  History of Anesthesia Complications Negative for: history of anesthetic complications  Airway Mallampati: III   Neck ROM: Full    Dental  (+) Missing   Pulmonary former smoker (quit 1980)   Pulmonary exam normal breath sounds clear to auscultation       Cardiovascular hypertension, + CAD and + Peripheral Vascular Disease  Normal cardiovascular exam Rhythm:Regular Rate:Normal  Echo 01/19/22:  NORMAL LEFT VENTRICULAR SYSTOLIC FUNCTION  NORMAL RIGHT VENTRICULAR SYSTOLIC FUNCTION  TRIVIAL REGURGITATION NOTED  NO VALVULAR STENOSIS   ECG 01/19/22:  Sinus rhythm with premature supraventricular complexes  Otherwise normal ECG     Neuro/Psych  Neuromuscular disease (Bells palsy)    GI/Hepatic negative GI ROS,,,  Endo/Other  diabetes, Type 2    Renal/GU negative Renal ROS   BPH    Musculoskeletal   Abdominal   Peds  Hematology negative hematology ROS (+)   Anesthesia Other Findings   Reproductive/Obstetrics                             Anesthesia Physical Anesthesia Plan  ASA: 2  Anesthesia Plan: General   Post-op Pain Management:    Induction: Intravenous  PONV Risk Score and Plan: 2 and Ondansetron, Dexamethasone and Treatment may vary due to age or medical condition  Airway Management Planned: Oral ETT  Additional Equipment:   Intra-op Plan:   Post-operative Plan: Extubation in OR  Informed Consent: I have reviewed the patients History and Physical, chart, labs and discussed the procedure including the risks, benefits and alternatives for the proposed anesthesia with the patient or authorized representative who has indicated his/her understanding and acceptance.     Dental advisory  given  Plan Discussed with: CRNA  Anesthesia Plan Comments: (Patient consented for risks of anesthesia including but not limited to:  - adverse reactions to medications - damage to eyes, teeth, lips or other oral mucosa - nerve damage due to positioning  - sore throat or hoarseness - damage to heart, brain, nerves, lungs, other parts of body or loss of life  Informed patient about role of CRNA in peri- and intra-operative care.  Patient voiced understanding.)        Anesthesia Quick Evaluation

## 2022-08-31 NOTE — Anesthesia Procedure Notes (Signed)
Procedure Name: Intubation Date/Time: 08/31/2022 8:48 AM  Performed by: Genia Del, CRNAPre-anesthesia Checklist: Patient identified, Emergency Drugs available, Suction available, Patient being monitored and Timeout performed Patient Re-evaluated:Patient Re-evaluated prior to induction Oxygen Delivery Method: Circle system utilized Preoxygenation: Pre-oxygenation with 100% oxygen Induction Type: IV induction Ventilation: Mask ventilation without difficulty Laryngoscope Size: Glidescope and 3 Grade View: Grade I Tube type: Oral Tube size: 7.5 mm Number of attempts: 1 Airway Equipment and Method: Stylet Placement Confirmation: positive ETCO2 and breath sounds checked- equal and bilateral Secured at: 23 (secured at 23 cm rt lip after pos ETC02 and BBS confirmed.) cm Tube secured with: Tape Dental Injury: Teeth and Oropharynx as per pre-operative assessment

## 2022-08-31 NOTE — Anesthesia Postprocedure Evaluation (Signed)
Anesthesia Post Note  Patient: Mylez Norman  Procedure(s) Performed: XI ROBOTIC ASSISTED INGUINAL HERNIA REPAIR WITH MESH (Right: Inguinal) INSERTION OF MESH (Right: Inguinal)  Patient location during evaluation: PACU Anesthesia Type: General Level of consciousness: awake and alert, oriented and patient cooperative Pain management: pain level controlled Vital Signs Assessment: post-procedure vital signs reviewed and stable Respiratory status: spontaneous breathing, nonlabored ventilation and respiratory function stable Cardiovascular status: blood pressure returned to baseline and stable Postop Assessment: adequate PO intake Anesthetic complications: no   No notable events documented.   Last Vitals:  Vitals:   08/31/22 1045 08/31/22 1054  BP: 114/66 123/76  Pulse: (!) 48 67  Resp: (!) 21 20  Temp: (!) 36.3 C (!) 36.4 C  SpO2: 100% 99%    Last Pain:  Vitals:   08/31/22 1054  TempSrc: Temporal  PainSc: 5                  Reed Breech

## 2022-08-31 NOTE — Interval H&P Note (Signed)
History and Physical Interval Note:  08/31/2022 7:44 AM  Vincent Kirby  has presented today for surgery, with the diagnosis of K40.90 non recurrent unilateral inguianl hernia w/o obstruction or gangrene.  The various methods of treatment have been discussed with the patient and family. After consideration of risks, benefits and other options for treatment, the patient has consented to  Procedure(s): XI ROBOTIC ASSISTED INGUINAL HERNIA REPAIR WITH MESH (Right) as a surgical intervention.  The patient's history has been reviewed, patient examined, no change in status, stable for surgery.  I have reviewed the patient's chart and labs.  Questions were answered to the patient's satisfaction.     Carolan Shiver

## 2022-08-31 NOTE — Op Note (Signed)
Preoperative diagnosis: Right inguinal hernia.   Postoperative diagnosis: Right pantaloon inguinal hernia.  Procedure: Robotic assisted Laparoscopic Transabdominal preperitoneal laparoscopic (TAPP) repair of right inguinal hernia.  Anesthesia: GETA  Surgeon: Dr. Hazle Quant  Wound Classification: Clean  Indications:  Patient is a 77 y.o. male developed a symptomatic right inguinal hernia. Repair was indicated.  Findings: 1. Right pantaloon Inguinal hernia identified 2. Vas deferens and cord structures identified and preserved 3. Bard Extra Large 3D Max MID Anatomical mesh used for repair 4. Adequate hemostasis.   Description of procedure:  The patient was taken to the operating room and the correct side of surgery was verified. The patient was placed supine with right arm tucked at the side. After obtaining adequate anesthesia, the patient's abdomen was prepped and draped in standard sterile fashion. A time-out was completed verifying correct patient, procedure, site, positioning, and implant(s) and/or special equipment prior to beginning this procedure.  An incision was made in a natural skin line above the umbilicus. The fascia was elevated and the Veress needle inserted. Proper position was confirmed by aspiration and saline meniscus test.  The abdomen was insufflated with carbon dioxide to a pressure of 15 mmHg. The patient tolerated insufflation well.  Abdominal cavity was entered using Optiview technique with a millimeter trocar.  No injury was identified.  Another 2 mm trocars were placed lateral to each rectus muscle.  Scissors and bipolar forceps were inserted under direct visualization. At the robotic console: Transverse peritoneal incision is made about 8 cm superior to the inguinal defect. Medial to the epigastric vessels, the parietal compartment is dissected to visualize the rectus muscle. This is carried down to the symphysis pubis and the retropubic space is dissected to  expose at least 2 cm contralateral to the midline. Cooper's ligament is exposed and cleared at least 2 cm below the ligament to ensure adequate space for the inferior border of the mesh. Hesselbach's triangle is cleared assessing for a direct hernia. The hernia is reduced dissecting the contents away from the border of the transversalis (white) fascia. Lateral to the epigastric vessels, the dissection is carried out in visceral compartment continuing in the true preperitoneal plane. Indirect hernia sac, was carefully reduced and separated from the cord structures with medial retraction and a combination of blunt/sharp dissection and focused cautery. This dissection was continued until the cord structures are "parietalized" completely, allowing for visualization of the reflected peritoneum that is continuous with the line originating 2 cm below Coopers medially and across the psoas muscle in the lateral compartment.  The internal ring was interrogated for a cord lipoma. The cord lipoma was reduced to the retroperitoneum and seated dorsal to the preperitoneal mesh. Having achieved a complete dissection with a critical view of the entire myopectineal orifice, an XL mesh was then positioned centered at the iliopubic tract with the medial side crossing the midline and the inferior edge positioned 2 cm below Coopers ligament. The lateral aspect of the mesh extended 3-5 cm beyond the lateral edge of the psoas. The mesh is fixated using an interrupted suture placed to the ipsilateral Coopers ligament. A second suture was done at the medial superior aspect of the mesh fixating this to the rectus complex.  The peritoneal flap is closed with running barbed suture. Additional holes in the peritoneum closed with suture. Preperitoneal space gas aspirated to visualize the peritoneum apposed directly against the mesh and ensure no folding, lifting, or buckling of the mesh. Skin is closed, sterile dressings are applied.  The  patient tolerated the procedure well and was taken to the postanesthesia care unit in stable condition  Specimen: None  Complications: None  Estimated Blood Loss: 5 mL

## 2022-09-01 ENCOUNTER — Encounter: Payer: Self-pay | Admitting: General Surgery

## 2024-01-22 ENCOUNTER — Encounter: Payer: Self-pay | Admitting: Ophthalmology

## 2024-01-23 ENCOUNTER — Encounter: Payer: Self-pay | Admitting: Ophthalmology

## 2024-01-23 NOTE — Anesthesia Preprocedure Evaluation (Addendum)
 "                                  Anesthesia Evaluation  Patient identified by MRN, date of birth, ID band Patient awake    Reviewed: Allergy & Precautions, NPO status , Patient's Chart, lab work & pertinent test results  History of Anesthesia Complications Negative for: history of anesthetic complications  Airway Mallampati: III  TM Distance: >3 FB Neck ROM: Full    Dental  (+) Missing   Pulmonary former smoker   Pulmonary exam normal breath sounds clear to auscultation       Cardiovascular hypertension, + CAD and + Peripheral Vascular Disease  Normal cardiovascular exam Rhythm:Regular Rate:Normal  Echo 01/19/22:  NORMAL LEFT VENTRICULAR SYSTOLIC FUNCTION  NORMAL RIGHT VENTRICULAR SYSTOLIC FUNCTION  TRIVIAL REGURGITATION NOTED  NO VALVULAR STENOSIS   ECG 01/19/22:  Sinus rhythm with premature supraventricular complexes  Otherwise normal ECG     Neuro/Psych  Neuromuscular disease (Bells palsy)  negative psych ROS   GI/Hepatic negative GI ROS, Neg liver ROS,,,  Endo/Other  diabetes, Type 2    Renal/GU      Musculoskeletal   Abdominal   Peds  Hematology negative hematology ROS (+)   Anesthesia Other Findings Past Medical History: No date: Bell's palsy     Comment:  facial paralysis No date: BPH (benign prostatic hyperplasia) No date: History of Bell's palsy No date: Hyperlipidemia No date: Pre-diabetes No date: PVD (peripheral vascular disease)  Past Surgical History: No date: BACK SURGERY     Comment:  x 3  01/30/2018: COLONOSCOPY WITH PROPOFOL ; N/A     Comment:  Procedure: COLONOSCOPY WITH PROPOFOL ;  Surgeon:               Gaylyn Gladis PENNER, MD;  Location: [facility] END[facility];                Service: Endoscopy;  Laterality: N/A; 08/31/2022: INSERTION OF MESH; Right     Comment:  Procedure: INSERTION OF MESH;  Surgeon: Rodolph Romano, MD;  Location: [facility] ORS;  Service: General;                Laterality: Right; No  date: PROSTATE SURGERY No date: TONSILLECTOMY 08/31/2022: XI ROBOTIC ASSISTED INGUINAL HERNIA REPAIR WITH MESH; Right     Comment:  Procedure: XI ROBOTIC ASSISTED INGUINAL HERNIA REPAIR               WITH MESH;  Surgeon: Rodolph Romano, MD;                Location: [facility] ORS;  Service: General;  Laterality:               Right;  BMI    Body Mass Index: 23.06 kg/m      Reproductive/Obstetrics negative OB ROS                              Anesthesia Physical Anesthesia Plan  ASA: 2  Anesthesia Plan: MAC   Post-op Pain Management:    Induction: Intravenous  PONV Risk Score and Plan: 1 and TIVA and Midazolam   Airway Management Planned: Natural Airway and Nasal Cannula  Additional Equipment:   Intra-op Plan:   Post-operative Plan:   Informed Consent: I have reviewed the patients History and  Physical, chart, labs and discussed the procedure including the risks, benefits and alternatives for the proposed anesthesia with the patient or authorized representative who has indicated his/her understanding and acceptance.     Dental Advisory Given  Plan Discussed with: Anesthesiologist, CRNA and Surgeon  Anesthesia Plan Comments: (Patient consented for risks of anesthesia including but not limited to:  - adverse reactions to medications - damage to eyes, teeth, lips or other oral mucosa - nerve damage due to positioning  - sore throat or hoarseness - Damage to heart, brain, nerves, lungs, other parts of body or loss of life  Patient voiced understanding and assent.)        Anesthesia Quick Evaluation  "

## 2024-01-30 ENCOUNTER — Other Ambulatory Visit: Payer: Self-pay

## 2024-01-30 ENCOUNTER — Ambulatory Visit
Admission: RE | Admit: 2024-01-30 | Discharge: 2024-01-30 | Disposition: A | Discharge status: Home / Self Care | Attending: Ophthalmology | Admitting: Ophthalmology

## 2024-01-30 ENCOUNTER — Ambulatory Visit: Payer: Self-pay | Admitting: Anesthesiology

## 2024-01-30 ENCOUNTER — Encounter: Payer: Self-pay | Admitting: Ophthalmology

## 2024-01-30 ENCOUNTER — Encounter: Payer: Self-pay | Admitting: Anesthesiology

## 2024-01-30 ENCOUNTER — Encounter
Admission: RE | Disposition: A | Discharge status: Home / Self Care | Payer: Self-pay | Source: Home / Self Care | Attending: Ophthalmology

## 2024-01-30 DIAGNOSIS — G510 Bell's palsy: Secondary | ICD-10-CM | POA: Insufficient documentation

## 2024-01-30 DIAGNOSIS — E1136 Type 2 diabetes mellitus with diabetic cataract: Secondary | ICD-10-CM | POA: Insufficient documentation

## 2024-01-30 DIAGNOSIS — Z87891 Personal history of nicotine dependence: Secondary | ICD-10-CM | POA: Diagnosis not present

## 2024-01-30 DIAGNOSIS — I10 Essential (primary) hypertension: Secondary | ICD-10-CM | POA: Insufficient documentation

## 2024-01-30 DIAGNOSIS — H2512 Age-related nuclear cataract, left eye: Secondary | ICD-10-CM | POA: Insufficient documentation

## 2024-01-30 DIAGNOSIS — E1151 Type 2 diabetes mellitus with diabetic peripheral angiopathy without gangrene: Secondary | ICD-10-CM | POA: Diagnosis not present

## 2024-01-30 DIAGNOSIS — I2510 Atherosclerotic heart disease of native coronary artery without angina pectoris: Secondary | ICD-10-CM | POA: Diagnosis not present

## 2024-01-30 HISTORY — DX: Anemia, unspecified: D64.9

## 2024-01-30 HISTORY — PX: CATARACT EXTRACTION W/PHACO: SHX586

## 2024-01-30 SURGERY — PHACOEMULSIFICATION, CATARACT, WITH IOL INSERTION
Anesthesia: Monitor Anesthesia Care | Laterality: Left

## 2024-01-30 MED ORDER — SIGHTPATH DOSE#1 BSS IO SOLN
INTRAOCULAR | Status: DC | PRN
  Administered 2024-01-30: 15 mL via INTRAOCULAR

## 2024-01-30 MED ORDER — CYCLOPENTOLATE HCL 2 % OP SOLN
1.0000 [drp] | OPHTHALMIC | Status: AC | PRN
  Administered 2024-01-30 (×3): 1 [drp] via OPHTHALMIC

## 2024-01-30 MED ORDER — MOXIFLOXACIN HCL 0.5 % OP SOLN
OPHTHALMIC | Status: DC | PRN
  Administered 2024-01-30: .2 mL via OPHTHALMIC

## 2024-01-30 MED ORDER — SIGHTPATH DOSE#1 NA CHONDROIT SULF-NA HYALURON 40-17 MG/ML IO SOLN
INTRAOCULAR | Status: DC | PRN
  Administered 2024-01-30: 1 mL via INTRAOCULAR

## 2024-01-30 MED ORDER — MIDAZOLAM HCL (PF) 2 MG/2ML IJ SOLN
INTRAMUSCULAR | Status: DC | PRN
  Administered 2024-01-30: 1 mg via INTRAVENOUS

## 2024-01-30 MED ORDER — PHENYLEPHRINE HCL 10 % OP SOLN
OPHTHALMIC | Status: AC
  Filled 2024-01-30: qty 5

## 2024-01-30 MED ORDER — TETRACAINE HCL 0.5 % OP SOLN
OPHTHALMIC | Status: AC
  Filled 2024-01-30: qty 4

## 2024-01-30 MED ORDER — LIDOCAINE HCL (PF) 2 % IJ SOLN
INTRAOCULAR | Status: DC | PRN
  Administered 2024-01-30: 1 mL

## 2024-01-30 MED ORDER — PHENYLEPHRINE HCL 10 % OP SOLN
1.0000 [drp] | OPHTHALMIC | Status: AC | PRN
  Administered 2024-01-30 (×3): 1 [drp] via OPHTHALMIC

## 2024-01-30 MED ORDER — PHENYLEPHRINE-KETOROLAC 1-0.3 % IO SOLN
INTRAOCULAR | Status: DC | PRN
  Administered 2024-01-30: 60 mL via OPHTHALMIC

## 2024-01-30 MED ORDER — BRIMONIDINE TARTRATE-TIMOLOL 0.2-0.5 % OP SOLN
OPHTHALMIC | Status: DC | PRN
  Administered 2024-01-30: 1 [drp] via OPHTHALMIC

## 2024-01-30 MED ORDER — TETRACAINE HCL 0.5 % OP SOLN
1.0000 [drp] | OPHTHALMIC | Status: DC | PRN
  Administered 2024-01-30 (×3): 1 [drp] via OPHTHALMIC

## 2024-01-30 MED ORDER — CYCLOPENTOLATE HCL 2 % OP SOLN
OPHTHALMIC | Status: AC
  Filled 2024-01-30: qty 2

## 2024-01-30 MED ORDER — MIDAZOLAM HCL 2 MG/2ML IJ SOLN
INTRAMUSCULAR | Status: AC
  Filled 2024-01-30: qty 2

## 2024-01-30 MED ORDER — LACTATED RINGERS IV SOLN: INTRAVENOUS | Status: DC

## 2024-01-30 SURGICAL SUPPLY — 9 items
CANNULA ANT/CHMB 27G (MISCELLANEOUS) ×1 IMPLANT
CYSTOTOME ANGL RVRS SHRT 25G (CUTTER) ×1 IMPLANT
FEE CATARACT SUITE SIGHTPATH (MISCELLANEOUS) ×1 IMPLANT
GLOVE BIOGEL PI IND STRL 8 (GLOVE) ×1 IMPLANT
GLOVE SURG LX STRL 8.0 MICRO (GLOVE) ×1 IMPLANT
GLOVE SURG SYN 6.5 PF PI BL (GLOVE) ×1 IMPLANT
LENS IOL TECNIS EYHANCE 21.5 (Intraocular Lens) IMPLANT
NEEDLE FILTER BLUNT 18X1 1/2 (NEEDLE) ×1 IMPLANT
SYR 3ML LL SCALE MARK (SYRINGE) ×1 IMPLANT

## 2024-01-30 NOTE — Anesthesia Postprocedure Evaluation (Signed)
"   Anesthesia Post Note  Patient: Vincent Kirby  Procedure(s) Performed: PHACOEMULSIFICATION, CATARACT, WITH IOL INSERTION 15.38, 01:14.8 (Left)  Patient location during evaluation: PACU Anesthesia Type: MAC Level of consciousness: awake and alert Pain management: pain level controlled Vital Signs Assessment: post-procedure vital signs reviewed and stable Respiratory status: spontaneous breathing, nonlabored ventilation, respiratory function stable and patient connected to nasal cannula oxygen Cardiovascular status: blood pressure returned to baseline and stable Postop Assessment: no apparent nausea or vomiting Anesthetic complications: no   No notable events documented.   Last Vitals:  Vitals:   01/30/24 0821 01/30/24 0825  BP: 121/65 (!) 141/89  Pulse: (!) 53 61  Resp: 10 16  Temp: 36.8 C   SpO2: 99% 99%    Last Pain:  Vitals:   01/30/24 0825  TempSrc:   PainSc: 0-No pain                 Debby Mines      "

## 2024-01-30 NOTE — Discharge Instructions (Signed)

## 2024-01-30 NOTE — Op Note (Signed)
 PREOPERATIVE DIAGNOSIS:  Nuclear sclerotic cataract of the left eye.   POSTOPERATIVE DIAGNOSIS:  Nuclear sclerotic cataract of the left eye.   OPERATIVE PROCEDURE:ORPROCALL@   SURGEON:  Elsie Carmine, MD.   ANESTHESIA:  Anesthesiologist: Leavy Ned, MD CRNA: Pearl Ozell PARAS, CRNA  1.      Managed anesthesia care. 2.     0.35ml of Shugarcaine was instilled following the paracentesis   COMPLICATIONS:  None.   TECHNIQUE:   Stop and chop   DESCRIPTION OF PROCEDURE:  The patient was examined and consented in the preoperative holding area where the aforementioned topical anesthesia was applied to the left eye and then brought back to the Operating Room where the left eye was prepped and draped in the usual sterile ophthalmic fashion and a lid speculum was placed. A paracentesis was created with the side port blade and the anterior chamber was filled with viscoelastic. A near clear corneal incision was performed with the steel keratome. A continuous curvilinear capsulorrhexis was performed with a cystotome followed by the capsulorrhexis forceps. Hydrodissection and hydrodelineation were carried out with BSS on a blunt cannula. The lens was removed in a stop and chop  technique and the remaining cortical material was removed with the irrigation-aspiration handpiece. The capsular bag was inflated with viscoelastic and the intraocular lens was placed in the capsular bag without complication. The remaining viscoelastic was removed from the eye with the irrigation-aspiration handpiece. The wounds were hydrated. The anterior chamber was flushed with BSS and the eye was inflated to physiologic pressure. 0.56ml Vigamox  was placed in the anterior chamber. The wounds were found to be water tight. The eye was dressed with Combigan . The patient was given protective glasses to wear throughout the day and a shield with which to sleep tonight. The patient was also given drops with which to begin a drop regimen  today and will follow-up with me in one day. Implant Name Type Inv. Item Serial No. Manufacturer Lot No. LRB No. Used Action  LENS IOL TECNIS EYHANCE 21.5 - [id number] Intraocular Lens LENS IOL TECNIS EYHANCE 21.5 [id number] [employer]  Left 1 Implanted    Procedures: PHACOEMULSIFICATION, CATARACT, WITH IOL INSERTION 15.38, 01:14.8 (Left)  Electronically signed: Elsie Carmine 01/30/2024 8:18 AM

## 2024-01-30 NOTE — Transfer of Care (Signed)
 Immediate Anesthesia Transfer of Care Note  Patient: Vincent Kirby  Procedure(s) Performed: PHACOEMULSIFICATION, CATARACT, WITH IOL INSERTION 15.38, 01:14.8 (Left)  Patient Location: PACU  Anesthesia Type: MAC  Level of Consciousness: awake, alert  and patient cooperative  Airway and Oxygen Therapy: Patient Spontanous Breathing and Patient connected to supplemental oxygen  Post-op Assessment: Post-op Vital signs reviewed, Patient's Cardiovascular Status Stable, Respiratory Function Stable, Patent Airway and No signs of Nausea or vomiting  Post-op Vital Signs: Reviewed and stable  Complications: No notable events documented.

## 2024-01-30 NOTE — H&P (Signed)
 [facility]   Primary Care Physician:  Auston Reyes BIRCH, MD Ophthalmologist: Dr. Elsie Carmine  Pre-Procedure History & Physical: HPI:  Vincent Kirby is a 79 y.o. male here for cataract surgery.   Past Medical History:  Diagnosis Date   Bell's palsy    facial paralysis   BPH (benign prostatic hyperplasia)    History of Bell's palsy    Hyperlipidemia    Pre-diabetes    PVD (peripheral vascular disease)     Past Surgical History:  Procedure Laterality Date   BACK SURGERY     x 3    COLONOSCOPY WITH PROPOFOL  N/A 01/30/2018   Procedure: COLONOSCOPY WITH PROPOFOL ;  Surgeon: Gaylyn Gladis PENNER, MD;  Location: [facility] ENDOSCOPY;  Service: Endoscopy;  Laterality: N/A;   INSERTION OF MESH Right 08/31/2022   Procedure: INSERTION OF MESH;  Surgeon: Rodolph Romano, MD;  Location: [facility] ORS;  Service: General;  Laterality: Right;   PROSTATE SURGERY     TONSILLECTOMY     XI ROBOTIC ASSISTED INGUINAL HERNIA REPAIR WITH MESH Right 08/31/2022   Procedure: XI ROBOTIC ASSISTED INGUINAL HERNIA REPAIR WITH MESH;  Surgeon: Rodolph Romano, MD;  Location: [facility] ORS;  Service: General;  Laterality: Right;    Prior to Admission medications  Medication Sig Start Date End Date Taking? Authorizing Provider  aspirin  EC 81 MG tablet Take 1 tablet (81 mg total) by mouth daily. 09/01/16  Yes Gollan, Timothy J, MD  Multiple Vitamin (MULTI-VITAMINS) TABS Take 1 tablet by mouth daily.   Yes [provider]  rosuvastatin  (CRESTOR ) 20 MG tablet Take 20 mg by mouth at bedtime.   Yes [provider]    Allergies as of 01/22/2024   (No Known Allergies)    Family History  Problem Relation Age of Onset   Hypertension Mother    Stroke Mother 58   Hypertension Father    Stroke Father 16   Hypertension Brother     Social History   Socioeconomic History   Marital status: Married    Spouse name: Not on file   Number of children: Not on file   Years of education: Not on file    Highest education level: Not on file  Occupational History   Not on file  Tobacco Use   Smoking status: Former    Current packs/day: 0.00    Average packs/day: 1 pack/day for 15.0 years (15.0 ttl pk-yrs)    Types: Cigarettes    Start date: 36    Quit date: 43    Years since quitting: 46.0   Smokeless tobacco: Never  Vaping Use   Vaping status: Never Used  Substance and Sexual Activity   Alcohol use: Yes    Comment: 1x per week   Drug use: No   Sexual activity: Not on file  Other Topics Concern   Not on file  Social History Narrative   Not on file   Social Drivers of Health   Tobacco Use: Medium Risk (01/30/2024)   Patient History    Smoking Tobacco Use: Former    Smokeless Tobacco Use: Never    Passive Exposure: Not on file  Financial Resource Strain: Low Risk  (11/28/2023)   Received [facility]   Overall Financial Resource Strain (CARDIA)    Difficulty of Paying Living Expenses: Not hard at all  Food Insecurity: No Food Insecurity (11/28/2023)   Received from [facility]   Epic    Within the past 12 months, you worried  that your food would run out before you got the money to buy more.: Never true    Within the past 12 months, the food you bought just didn't last and you didn't have money to get more.: Never true  Transportation Needs: No Transportation Needs (11/28/2023)   Received from [facility] - Transportation    In the past 12 months, has lack of transportation kept you from medical appointments or from getting medications?: No    Lack of Transportation (Non-Medical): No  Physical Activity: Not on file  Stress: Not on file  Social Connections: Not on file  Intimate Partner Violence: Not on file  Depression (EYV7-0): Not on file  Alcohol Screen: Not on file  Housing: Low Risk  (11/28/2023)   Received from [facility]   Epic    In the last 12 months, was there a time  when you were not able to pay the mortgage or rent on time?: No    In the past 12 months, how many times have you moved where you were living?: 0    At any time in the past 12 months, were you homeless or living in a shelter (including now)?: No  Utilities: Not At Risk (11/28/2023)   Received from [facility]   Epic    In the past 12 months has the electric, gas, oil, or water company threatened to shut off services in your home?: No  Health Literacy: Not on file    Review of Systems: See HPI, otherwise negative ROS  Physical Exam: BP (!) 143/69   Pulse 60   Temp 98.9 F (37.2 C) (Temporal)   Resp 11   Ht 6' (1.829 m)   Wt 77.1 kg   SpO2 99%   BMI 23.06 kg/m  General:   Alert, cooperative. Head:  Normocephalic and atraumatic. Respiratory:  Normal work of breathing. Cardiovascular:  NAD  Impression/Plan: Vincent Kirby is here for cataract surgery.  Risks, benefits, limitations, and alternatives regarding cataract surgery have been reviewed with the patient.  Questions have been answered.  All parties agreeable.   Elsie Carmine, MD  01/30/2024, 7:52 AM

## 2024-01-30 NOTE — Anesthesia Procedure Notes (Signed)
 Date/Time: 01/30/2024 8:10 AM  Performed by: Pearl Ozell PARAS, CRNAPre-anesthesia Checklist: Patient identified, Emergency Drugs available, Suction available, Timeout performed and Patient being monitored Patient Re-evaluated:Patient Re-evaluated prior to induction Oxygen Delivery Method: Nasal cannula Placement Confirmation: positive ETCO2

## 2024-01-31 ENCOUNTER — Encounter: Payer: Self-pay | Admitting: Ophthalmology

## 2024-02-13 ENCOUNTER — Ambulatory Visit
Admission: RE | Admit: 2024-02-13 | Discharge: 2024-02-13 | Disposition: A | Discharge status: Home / Self Care | Attending: Ophthalmology | Admitting: Ophthalmology

## 2024-02-13 ENCOUNTER — Encounter: Payer: Self-pay | Admitting: Ophthalmology

## 2024-02-13 ENCOUNTER — Ambulatory Visit: Payer: Self-pay | Admitting: Anesthesiology

## 2024-02-13 ENCOUNTER — Other Ambulatory Visit: Payer: Self-pay

## 2024-02-13 ENCOUNTER — Encounter
Admission: RE | Disposition: A | Discharge status: Home / Self Care | Payer: Self-pay | Source: Home / Self Care | Attending: Ophthalmology

## 2024-02-13 DIAGNOSIS — H2511 Age-related nuclear cataract, right eye: Secondary | ICD-10-CM | POA: Diagnosis present

## 2024-02-13 DIAGNOSIS — Z87891 Personal history of nicotine dependence: Secondary | ICD-10-CM | POA: Diagnosis not present

## 2024-02-13 HISTORY — PX: CATARACT EXTRACTION W/PHACO: SHX586

## 2024-02-13 MED ORDER — FENTANYL CITRATE (PF) 100 MCG/2ML IJ SOLN
INTRAMUSCULAR | Status: DC | PRN
  Administered 2024-02-13: 50 ug via INTRAVENOUS

## 2024-02-13 MED ORDER — PHENYLEPHRINE HCL 10 % OP SOLN
OPHTHALMIC | Status: AC
  Filled 2024-02-13: qty 5

## 2024-02-13 MED ORDER — MIDAZOLAM HCL 2 MG/2ML IJ SOLN
INTRAMUSCULAR | Status: AC
  Filled 2024-02-13: qty 2

## 2024-02-13 MED ORDER — LACTATED RINGERS IV SOLN: INTRAVENOUS | Status: DC

## 2024-02-13 MED ORDER — LIDOCAINE HCL (PF) 2 % IJ SOLN
INTRAOCULAR | Status: DC | PRN
  Administered 2024-02-13: 2 mL

## 2024-02-13 MED ORDER — MIDAZOLAM HCL (PF) 2 MG/2ML IJ SOLN
INTRAMUSCULAR | Status: DC | PRN
  Administered 2024-02-13: 1 mg via INTRAVENOUS

## 2024-02-13 MED ORDER — SIGHTPATH DOSE#1 BSS IO SOLN: INTRAOCULAR | Status: DC | PRN

## 2024-02-13 MED ORDER — SIGHTPATH DOSE#1 BSS IO SOLN
INTRAOCULAR | Status: DC | PRN
  Administered 2024-02-13: 15 mL via INTRAOCULAR

## 2024-02-13 MED ORDER — MOXIFLOXACIN HCL 0.5 % OP SOLN
OPHTHALMIC | Status: DC | PRN
  Administered 2024-02-13: .2 mL via OPHTHALMIC

## 2024-02-13 MED ORDER — BRIMONIDINE TARTRATE-TIMOLOL 0.2-0.5 % OP SOLN
OPHTHALMIC | Status: DC | PRN
  Administered 2024-02-13: 1 [drp] via OPHTHALMIC

## 2024-02-13 MED ORDER — CYCLOPENTOLATE HCL 2 % OP SOLN
1.0000 [drp] | OPHTHALMIC | Status: AC | PRN
  Administered 2024-02-13 (×3): 1 [drp] via OPHTHALMIC

## 2024-02-13 MED ORDER — TETRACAINE HCL 0.5 % OP SOLN
OPHTHALMIC | Status: AC
  Filled 2024-02-13: qty 4

## 2024-02-13 MED ORDER — CYCLOPENTOLATE HCL 2 % OP SOLN
OPHTHALMIC | Status: AC
  Filled 2024-02-13: qty 2

## 2024-02-13 MED ORDER — SIGHTPATH DOSE#1 NA CHONDROIT SULF-NA HYALURON 40-17 MG/ML IO SOLN
INTRAOCULAR | Status: DC | PRN
  Administered 2024-02-13: 1 mL via INTRAOCULAR

## 2024-02-13 MED ORDER — TETRACAINE HCL 0.5 % OP SOLN
1.0000 [drp] | OPHTHALMIC | Status: DC | PRN
  Administered 2024-02-13 (×3): 1 [drp] via OPHTHALMIC

## 2024-02-13 MED ORDER — PHENYLEPHRINE HCL 10 % OP SOLN
1.0000 [drp] | OPHTHALMIC | Status: AC | PRN
  Administered 2024-02-13 (×3): 1 [drp] via OPHTHALMIC

## 2024-02-13 MED ORDER — FENTANYL CITRATE (PF) 100 MCG/2ML IJ SOLN
INTRAMUSCULAR | Status: AC
  Filled 2024-02-13: qty 2

## 2024-02-13 MED ORDER — PHENYLEPHRINE-KETOROLAC 1-0.3 % IO SOLN
INTRAOCULAR | Status: DC | PRN
  Administered 2024-02-13: 81 mL via OPHTHALMIC

## 2024-02-13 NOTE — Op Note (Signed)
 PREOPERATIVE DIAGNOSIS:  Nuclear sclerotic cataract of the right eye.   POSTOPERATIVE DIAGNOSIS:  Right Eye Cataract   OPERATIVE PROCEDURE:ORPROCALL@   SURGEON:  Elsie Carmine, MD.   ANESTHESIA:  Anesthesiologist: Maggioncalda, Fairy LABOR, MD CRNA: Jahoo, Sonia, CRNA  1.      Managed anesthesia care. 2.      0.70ml of Shugarcaine was instilled in the eye following the paracentesis.   COMPLICATIONS:  None.   TECHNIQUE:   Stop and chop   DESCRIPTION OF PROCEDURE:  The patient was examined and consented in the preoperative holding area where the aforementioned topical anesthesia was applied to the right eye and then brought back to the Operating Room where the right eye was prepped and draped in the usual sterile ophthalmic fashion and a lid speculum was placed. A paracentesis was created with the side port blade and the anterior chamber was filled with viscoelastic. A near clear corneal incision was performed with the steel keratome. A continuous curvilinear capsulorrhexis was performed with a cystotome followed by the capsulorrhexis forceps. Hydrodissection and hydrodelineation were carried out with BSS on a blunt cannula. The lens was removed in a stop and chop  technique and the remaining cortical material was removed with the irrigation-aspiration handpiece. The capsular bag was inflated with viscoelastic and the intraocular lens was placed in the capsular bag without complication. The remaining viscoelastic was removed from the eye with the irrigation-aspiration handpiece. The wounds were hydrated. The anterior chamber was flushed with BSS and the eye was inflated to physiologic pressure. 0.1ml of Vigamox  was placed in the anterior chamber. The wounds were found to be water tight. The eye was dressed with Combigan . The patient was given protective glasses to wear throughout the day and a shield with which to sleep tonight. The patient was also given drops with which to begin a drop regimen  today and will follow-up with me in one day. Implant Name Type Inv. Item Serial No. Manufacturer Lot No. LRB No. Used Action  LENS IOL TECNIS EYHANCE 22.0 - [id number] Intraocular Lens LENS IOL TECNIS EYHANCE 22.0 [id number] [employer]  Right 1 Implanted   Procedures: PHACOEMULSIFICATION, CATARACT, WITH IOL INSERTION 13.40 01:10.4 (Right)  Electronically signed: Elsie Carmine 02/13/2024 7:47 AM

## 2024-02-13 NOTE — Anesthesia Postprocedure Evaluation (Signed)
"   Anesthesia Post Note  Patient: Vincent Kirby  Procedure(s) Performed: PHACOEMULSIFICATION, CATARACT, WITH IOL INSERTION 13.40 01:10.4 (Right: Eye)  Patient location during evaluation: PACU Anesthesia Type: MAC Level of consciousness: awake and alert Pain management: pain level controlled Vital Signs Assessment: post-procedure vital signs reviewed and stable Respiratory status: spontaneous breathing, nonlabored ventilation, respiratory function stable and patient connected to nasal cannula oxygen Cardiovascular status: blood pressure returned to baseline and stable Postop Assessment: no apparent nausea or vomiting Anesthetic complications: no   No notable events documented.   Last Vitals:  Vitals:   02/13/24 0749 02/13/24 0753  BP: 134/71 131/74  Pulse: (!) 55 (!) 52  Resp: 12 15  Temp: 36.6 C 36.6 C  SpO2: 100% 100%    Last Pain:  Vitals:   02/13/24 0753  TempSrc:   PainSc: 0-No pain                 Fairy A Hellena Pridgen      "

## 2024-02-13 NOTE — Anesthesia Preprocedure Evaluation (Signed)
"                                    Anesthesia Evaluation  Patient identified by MRN, date of birth, ID band Patient awake    Reviewed: Allergy & Precautions, H&P , NPO status , Patient's Chart, lab work & pertinent test results  Airway Mallampati: II  TM Distance: >3 FB Neck ROM: Full    Dental no notable dental hx. (+) Teeth Intact   Pulmonary neg pulmonary ROS, former smoker   Pulmonary exam normal breath sounds clear to auscultation       Cardiovascular negative cardio ROS Normal cardiovascular exam Rhythm:Regular Rate:Normal     Neuro/Psych negative neurological ROS  negative psych ROS   GI/Hepatic negative GI ROS, Neg liver ROS,,,  Endo/Other  negative endocrine ROS    Renal/GU negative Renal ROS  negative genitourinary   Musculoskeletal negative musculoskeletal ROS (+)    Abdominal   Peds negative pediatric ROS (+)  Hematology negative hematology ROS (+)   Anesthesia Other Findings   Reproductive/Obstetrics negative OB ROS                              Anesthesia Physical Anesthesia Plan  ASA: 2  Anesthesia Plan: MAC   Post-op Pain Management:    Induction: Intravenous  PONV Risk Score and Plan:   Airway Management Planned:   Additional Equipment:   Intra-op Plan:   Post-operative Plan: Extubation in OR  Informed Consent: I have reviewed the patients History and Physical, chart, labs and discussed the procedure including the risks, benefits and alternatives for the proposed anesthesia with the patient or authorized representative who has indicated his/her understanding and acceptance.     Dental advisory given  Plan Discussed with: CRNA  Anesthesia Plan Comments:         Anesthesia Quick Evaluation  "

## 2024-02-13 NOTE — Transfer of Care (Signed)
 Immediate Anesthesia Transfer of Care Note  Patient: Vincent Kirby  Procedure(s) Performed: PHACOEMULSIFICATION, CATARACT, WITH IOL INSERTION 13.40 01:10.4 (Right: Eye)  Patient Location: PACU  Anesthesia Type: MAC  Level of Consciousness: awake, alert  and patient cooperative  Airway and Oxygen Therapy: Patient Spontanous Breathing and Patient connected to supplemental oxygen  Post-op Assessment: Post-op Vital signs reviewed, Patient's Cardiovascular Status Stable, Respiratory Function Stable, Patent Airway and No signs of Nausea or vomiting  Post-op Vital Signs: Reviewed and stable  Complications: No notable events documented.

## 2024-02-13 NOTE — H&P (Signed)
 [facility]   Primary Care Physician:  Auston Reyes BIRCH, MD Ophthalmologist: Dr. Elsie Carmine  Pre-Procedure History & Physical: HPI:  Vincent Kirby is a 79 y.o. male here for cataract surgery.   Past Medical History:  Diagnosis Date   Bell's palsy    facial paralysis   BPH (benign prostatic hyperplasia)    History of Bell's palsy    Hyperlipidemia    Pre-diabetes    PVD (peripheral vascular disease)     Past Surgical History:  Procedure Laterality Date   BACK SURGERY     x 3    CATARACT EXTRACTION W/PHACO Left 01/30/2024   Procedure: PHACOEMULSIFICATION, CATARACT, WITH IOL INSERTION 15.38, 01:14.8;  Surgeon: Carmine Elsie, MD;  Location: [facility];  Service: Ophthalmology;  Laterality: Left;   COLONOSCOPY WITH PROPOFOL  N/A 01/30/2018   Procedure: COLONOSCOPY WITH PROPOFOL ;  Surgeon: Gaylyn Gladis PENNER, MD;  Location: [facility] ENDOSCOPY;  Service: Endoscopy;  Laterality: N/A;   INSERTION OF MESH Right 08/31/2022   Procedure: INSERTION OF MESH;  Surgeon: Rodolph Romano, MD;  Location: [facility] ORS;  Service: General;  Laterality: Right;   PROSTATE SURGERY     TONSILLECTOMY     XI ROBOTIC ASSISTED INGUINAL HERNIA REPAIR WITH MESH Right 08/31/2022   Procedure: XI ROBOTIC ASSISTED INGUINAL HERNIA REPAIR WITH MESH;  Surgeon: Rodolph Romano, MD;  Location: [facility] ORS;  Service: General;  Laterality: Right;    Prior to Admission medications  Medication Sig Start Date End Date Taking? Authorizing Provider  aspirin  EC 81 MG tablet Take 1 tablet (81 mg total) by mouth daily. 09/01/16  Yes Gollan, Timothy J, MD  Multiple Vitamin (MULTI-VITAMINS) TABS Take 1 tablet by mouth daily.   Yes [provider]  rosuvastatin  (CRESTOR ) 20 MG tablet Take 20 mg by mouth at bedtime.   Yes [provider]    Allergies as of 01/22/2024   (No Known Allergies)    Family History  Problem Relation Age of Onset   Hypertension Mother    Stroke Mother 85    Hypertension Father    Stroke Father 59   Hypertension Brother     Social History   Socioeconomic History   Marital status: Married    Spouse name: Not on file   Number of children: Not on file   Years of education: Not on file   Highest education level: Not on file  Occupational History   Not on file  Tobacco Use   Smoking status: Former    Current packs/day: 0.00    Average packs/day: 1 pack/day for 15.0 years (15.0 ttl pk-yrs)    Types: Cigarettes    Start date: 31    Quit date: 53    Years since quitting: 46.0   Smokeless tobacco: Never  Vaping Use   Vaping status: Never Used  Substance and Sexual Activity   Alcohol use: Yes    Comment: 1x per week   Drug use: No   Sexual activity: Not on file  Other Topics Concern   Not on file  Social History Narrative   Not on file   Social Drivers of Health   Tobacco Use: Medium Risk (02/13/2024)   Patient History    Smoking Tobacco Use: Former    Smokeless Tobacco Use: Never    Passive Exposure: Not on file  Financial Resource Strain: Low Risk  (11/28/2023)   Received from [facility]   Overall Financial Resource Strain (CARDIA)    Difficulty of Paying Living  Expenses: Not hard at all  Food Insecurity: No Food Insecurity (11/28/2023)   Received from [facility]   Epic    Within the past 12 months, you worried that your food would run out before you got the money to buy more.: Never true    Within the past 12 months, the food you bought just didn't last and you didn't have money to get more.: Never true  Transportation Needs: No Transportation Needs (11/28/2023)   Received from [facility] - Transportation    In the past 12 months, has lack of transportation kept you from medical appointments or from getting medications?: No    Lack of Transportation (Non-Medical): No  Physical Activity: Not on file  Stress: Not on file  Social Connections: Not on file   Intimate Partner Violence: Not on file  Depression (EYV7-0): Not on file  Alcohol Screen: Not on file  Housing: Low Risk  (11/28/2023)   Received from [facility]   Epic    In the last 12 months, was there a time when you were not able to pay the mortgage or rent on time?: No    In the past 12 months, how many times have you moved where you were living?: 0    At any time in the past 12 months, were you homeless or living in a shelter (including now)?: No  Utilities: Not At Risk (11/28/2023)   Received from [facility]   Epic    In the past 12 months has the electric, gas, oil, or water company threatened to shut off services in your home?: No  Health Literacy: Not on file    Review of Systems: See HPI, otherwise negative ROS  Physical Exam: BP 130/77   Temp 97.9 F (36.6 C) (Temporal)   Resp 14   Ht 6' 0.01 (1.829 m)   Wt 79.8 kg   SpO2 100%   BMI 23.86 kg/m  General:   Alert, cooperative. Head:  Normocephalic and atraumatic. Respiratory:  Normal work of breathing. Cardiovascular:  NAD  Impression/Plan: Vincent Kirby is here for cataract surgery.  Risks, benefits, limitations, and alternatives regarding cataract surgery have been reviewed with the patient.  Questions have been answered.  All parties agreeable.   Elsie Carmine, MD  02/13/2024, 7:15 AM

## 2024-02-13 NOTE — Discharge Instructions (Signed)
